# Patient Record
Sex: Male | Born: 1970 | Race: White | Hispanic: No | Marital: Married | Smoking: Never smoker
Health system: Southern US, Community
[De-identification: ages and names within clinical notes are randomized; demographics above are authoritative.]

## PROBLEM LIST (undated history)

## (undated) DIAGNOSIS — K219 Gastro-esophageal reflux disease without esophagitis: Secondary | ICD-10-CM

## (undated) DIAGNOSIS — R1310 Dysphagia, unspecified: Secondary | ICD-10-CM

## (undated) HISTORY — DX: Gastro-esophageal reflux disease without esophagitis: K21.9

## (undated) HISTORY — PX: FINGER SURGERY: SHX640

## (undated) HISTORY — DX: Dysphagia, unspecified: R13.10

## (undated) HISTORY — PX: SHOULDER SURGERY: SHX246

---

## 2002-08-05 ENCOUNTER — Encounter: Payer: Self-pay | Admitting: Emergency Medicine

## 2002-08-05 ENCOUNTER — Emergency Department (HOSPITAL_COMMUNITY): Admission: EM | Admit: 2002-08-05 | Discharge: 2002-08-05 | Payer: Self-pay | Admitting: Emergency Medicine

## 2007-05-17 ENCOUNTER — Emergency Department (HOSPITAL_COMMUNITY): Admission: EM | Admit: 2007-05-17 | Discharge: 2007-05-17 | Payer: Self-pay | Admitting: Emergency Medicine

## 2009-09-05 ENCOUNTER — Emergency Department (HOSPITAL_COMMUNITY): Admission: EM | Admit: 2009-09-05 | Discharge: 2009-09-05 | Payer: Self-pay | Admitting: Emergency Medicine
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
## 2011-06-21 ENCOUNTER — Encounter: Payer: Self-pay | Admitting: Internal Medicine

## 2011-06-22 ENCOUNTER — Encounter: Payer: Self-pay | Admitting: Gastroenterology

## 2011-06-22 ENCOUNTER — Ambulatory Visit (INDEPENDENT_AMBULATORY_CARE_PROVIDER_SITE_OTHER): Payer: BC Managed Care – PPO | Admitting: Gastroenterology

## 2011-06-22 VITALS — BP 110/76 | HR 72 | Temp 98.0°F | Ht 72.0 in | Wt 174.2 lb

## 2011-06-22 DIAGNOSIS — R1310 Dysphagia, unspecified: Secondary | ICD-10-CM | POA: Insufficient documentation

## 2011-06-22 DIAGNOSIS — K219 Gastro-esophageal reflux disease without esophagitis: Secondary | ICD-10-CM

## 2011-06-22 NOTE — Progress Notes (Signed)
Referring Provider: [name], PA/Dr. Phillips Odor Primary Care Physician:  Colette Ribas, MD Primary Gastroenterologist:  Dr. Jena Gauss  Chief Complaint  Patient presents with  . Dysphagia    HPI:   Miguel Reynolds is a pleasant 40 year old Caucasian male who presents today at the request of [name], [place], secondary to dysphagia. He reports dysphagia and odynophagia X 1 year, intermittent. Difficulty with tougher textures, states sandwhiches get "hung". He was started on Prilosec 40 mg mid October, and he has noticed a significant improvement in his symptoms. Does admit prior to this noticed worsening of reflux, needing Tums and Zantac. However, he had not tried a PPI until recently. As stated above, he has notable improvement. He is still wanting to pursue an EGD.   No abdominal pain. No melena. No wt loss or lack of appetite.  Takes ibuprofen, intermittently.    Past Medical History  Diagnosis Date  . GERD (gastroesophageal reflux disease)     Past Surgical History  Procedure Date  . Finger surgery     pins    Current Outpatient Prescriptions  Medication Sig Dispense Refill  . OLANZapine (ZYPREXA) 10 MG tablet Take 10 mg by mouth at bedtime.        Marland Kitchen omeprazole (PRILOSEC) 40 MG capsule Take 40 mg by mouth daily.          Allergies as of 06/22/2011  . (No Known Allergies)    Family History  Problem Relation Age of Onset  . Colon cancer Neg Hx   . Colon polyps Neg Hx     History   Social History  . Marital Status: Married    Spouse Name: N/A    Number of Children: N/A  . Years of Education: N/A   Occupational History  . Curator    Social History Main Topics  . Smoking status: Never Smoker   . Smokeless tobacco: Not on file  . Alcohol Use: No     rarely  . Drug Use: No  . Sexually Active: Not on file   Other Topics Concern  . Not on file   Social History Narrative  . No narrative on file    Review of Systems: Gen: Denies any fever, chills, loss  of appetite, fatigue, weight loss. CV: Denies chest pain, heart palpitations, syncope, peripheral edema. Resp: Denies shortness of breath with rest, cough, wheezing GI: SEE HPI GU : Denies urinary burning, urinary frequency, urinary incontinence.  MS: Denies joint pain, muscle weakness, cramps, limited movement Derm: Denies rash, itching, dry skin Psych: Denies depression, anxiety, confusion or memory loss  Heme: Denies bruising, bleeding, and enlarged lymph nodes.  Physical Exam: BP 110/76  Pulse 72  Temp(Src) 98 F (36.7 C) (Temporal)  Ht 6' (1.829 m)  Wt 174 lb 3.2 oz (79.017 kg)  BMI 23.63 kg/m2 General:   Alert and oriented. Well-developed, well-nourished, pleasant and cooperative. Head:  Normocephalic and atraumatic. Eyes:  Conjunctiva pink, sclera clear, no icterus.    Ears:  Normal auditory acuity. Nose:  No deformity, discharge,  or lesions. Mouth:  No deformity or lesions, mucosa pink and moist.  Neck:  Supple, without mass or thyromegaly. Lungs:  Clear to auscultation bilaterally, without wheezing, rales, or rhonchi.  Heart:  S1, S2 present without murmurs noted.  Abdomen:  +BS, soft, non-tender and non-distended. Without mass or HSM. No rebound or guarding. No hernias noted. Rectal:  Deferred  Msk:  Symmetrical without gross deformities. Normal posture. Extremities:  Without clubbing or edema. Neurologic:  Alert and  oriented x4;  grossly normal neurologically. Skin:  Intact, warm and dry without significant lesions or rashes Cervical Nodes:  No significant cervical adenopathy. Psych:  Alert and cooperative. Normal mood and affect.

## 2011-06-22 NOTE — Assessment & Plan Note (Signed)
Likely secondary to uncontrolled GERD, unable to r/o web, ring, or stricture. Will proceed with EGD as planned.

## 2011-06-22 NOTE — Assessment & Plan Note (Signed)
40 year old male with year-long symptoms of GERD and intermittent dysphagia/odynophagia. This has improved significantly since starting Prilosec 40 mg in mid October. In fact, he has had no further dysphagia since this time. However, he states he is concerned and would like to pursue an EGD in the near future. This is reasonable. We will have him continue Prilosec daily and proceed with an EGD with Dr. Jena Gauss.  Proceed with upper endoscopy in the near future with Dr. Jena Gauss. The risks, benefits, and alternatives have been discussed in detail with patient. They have stated understanding and desire to proceed.

## 2011-06-22 NOTE — Progress Notes (Signed)
Cc to PCP 

## 2011-06-22 NOTE — Patient Instructions (Signed)
We have set you up for an upper endoscopy to look at your esophagus and stomach. This will be done with Dr. Jena Gauss.  In the meantime, continue the Prilosec you are taking.

## 2011-07-10 ENCOUNTER — Encounter (HOSPITAL_COMMUNITY): Payer: Self-pay | Admitting: Pharmacy Technician

## 2011-07-14 MED ORDER — SODIUM CHLORIDE 0.45 % IV SOLN: Freq: Once | INTRAVENOUS | Status: DC

## 2011-07-17 ENCOUNTER — Encounter (HOSPITAL_COMMUNITY): Payer: Self-pay | Admitting: *Deleted

## 2011-07-17 ENCOUNTER — Encounter (HOSPITAL_COMMUNITY)
Admission: RE | Disposition: A | Discharge status: Home / Self Care | Payer: Self-pay | Source: Home / Self Care | Attending: Internal Medicine

## 2011-07-17 ENCOUNTER — Ambulatory Visit (HOSPITAL_COMMUNITY)
Admission: RE | Admit: 2011-07-17 | Discharge: 2011-07-17 | Disposition: A | Discharge status: Home / Self Care | Payer: BC Managed Care – PPO | Attending: Internal Medicine | Admitting: Internal Medicine

## 2011-07-17 DIAGNOSIS — K219 Gastro-esophageal reflux disease without esophagitis: Secondary | ICD-10-CM | POA: Insufficient documentation

## 2011-07-17 DIAGNOSIS — R1310 Dysphagia, unspecified: Secondary | ICD-10-CM | POA: Insufficient documentation

## 2011-07-17 DIAGNOSIS — K449 Diaphragmatic hernia without obstruction or gangrene: Secondary | ICD-10-CM | POA: Insufficient documentation

## 2011-07-17 SURGERY — ESOPHAGOGASTRODUODENOSCOPY (EGD) WITH ESOPHAGEAL DILATION
Anesthesia: Moderate Sedation

## 2011-07-17 MED ORDER — MEPERIDINE HCL 100 MG/ML IJ SOLN
INTRAMUSCULAR | Status: DC | PRN
  Administered 2011-07-17 (×2): 50 mg via INTRAVENOUS

## 2011-07-17 MED ORDER — MIDAZOLAM HCL 5 MG/5ML IJ SOLN
INTRAMUSCULAR | Status: AC
  Filled 2011-07-17: qty 5

## 2011-07-17 MED ORDER — BUTAMBEN-TETRACAINE-BENZOCAINE 2-2-14 % EX AERO
INHALATION_SPRAY | CUTANEOUS | Status: DC | PRN
  Administered 2011-07-17: 2 via TOPICAL

## 2011-07-17 MED ORDER — MIDAZOLAM HCL 5 MG/5ML IJ SOLN
INTRAMUSCULAR | Status: DC | PRN
  Administered 2011-07-17 (×2): 2 mg via INTRAVENOUS

## 2011-07-17 MED ORDER — MEPERIDINE HCL 100 MG/ML IJ SOLN
INTRAMUSCULAR | Status: AC
  Filled 2011-07-17: qty 1

## 2011-07-17 MED ORDER — STERILE WATER FOR IRRIGATION IR SOLN
Status: DC | PRN
  Administered 2011-07-17: 11:00:00

## 2011-07-17 NOTE — H&P (Signed)
  I have seen & examined the patient prior to the procedure(s) today and reviewed the history and physical/consultation.  There have been no changes.  After consideration of the risks, benefits, alternatives and imponderables, the patient has consented to the procedure(s).   

## 2014-10-14 ENCOUNTER — Ambulatory Visit (INDEPENDENT_AMBULATORY_CARE_PROVIDER_SITE_OTHER): Payer: Managed Care, Other (non HMO) | Admitting: Neurology

## 2014-10-14 ENCOUNTER — Encounter: Payer: Self-pay | Admitting: Neurology

## 2014-10-14 VITALS — BP 116/86 | HR 72 | Resp 16 | Ht 72.0 in | Wt 185.0 lb

## 2014-10-14 DIAGNOSIS — G478 Other sleep disorders: Secondary | ICD-10-CM | POA: Diagnosis not present

## 2014-10-14 DIAGNOSIS — R0683 Snoring: Secondary | ICD-10-CM

## 2014-10-14 DIAGNOSIS — L503 Dermatographic urticaria: Secondary | ICD-10-CM | POA: Diagnosis not present

## 2014-10-14 DIAGNOSIS — G4731 Primary central sleep apnea: Secondary | ICD-10-CM
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
## 2014-10-14 NOTE — Patient Instructions (Signed)
Sleep Apnea  Sleep apnea is a sleep disorder characterized by abnormal pauses in breathing while you sleep. When your breathing pauses, the level of oxygen in your blood decreases. This causes you to move out of deep sleep and into light sleep. As a result, your quality of sleep is poor, and the system that carries your blood throughout your body (cardiovascular system) experiences stress. If sleep apnea remains untreated, the following conditions can develop:  High blood pressure (hypertension).  Coronary artery disease.  Inability to achieve or maintain an erection (impotence).  Impairment of your thought process (cognitive dysfunction). There are three types of sleep apnea: 1. Obstructive sleep apnea--Pauses in breathing during sleep because of a blocked airway. 2. Central sleep apnea--Pauses in breathing during sleep because the area of the brain that controls your breathing does not send the correct signals to the muscles that control breathing. 3. Mixed sleep apnea--A combination of both obstructive and central sleep apnea. RISK FACTORS The following risk factors can increase your risk of developing sleep apnea:  Being overweight.  Smoking.  Having narrow passages in your nose and throat.  Being of older age.  Being male.  Alcohol use.  Sedative and tranquilizer use.  Ethnicity. Among individuals younger than 35 years, African Americans are at increased risk of sleep apnea. SYMPTOMS   Difficulty staying asleep.  Daytime sleepiness and fatigue.  Loss of energy.  Irritability.  Loud, heavy snoring.  Morning headaches.  Trouble concentrating.  Forgetfulness.  Decreased interest in sex. DIAGNOSIS  In order to diagnose sleep apnea, your caregiver will perform a physical examination. Your caregiver may suggest that you take a home sleep test. Your caregiver may also recommend that you spend the night in a sleep lab. In the sleep lab, several monitors record  information about your heart, lungs, and brain while you sleep. Your leg and arm movements and blood oxygen level are also recorded. TREATMENT The following actions may help to resolve mild sleep apnea:  Sleeping on your side.   Using a decongestant if you have nasal congestion.   Avoiding the use of depressants, including alcohol, sedatives, and narcotics.   Losing weight and modifying your diet if you are overweight. There also are devices and treatments to help open your airway:  Oral appliances. These are custom-made mouthpieces that shift your lower jaw forward and slightly open your bite. This opens your airway.  Devices that create positive airway pressure. This positive pressure "splints" your airway open to help you breathe better during sleep. The following devices create positive airway pressure:  Continuous positive airway pressure (CPAP) device. The CPAP device creates a continuous level of air pressure with an air pump. The air is delivered to your airway through a mask while you sleep. This continuous pressure keeps your airway open.  Nasal expiratory positive airway pressure (EPAP) device. The EPAP device creates positive air pressure as you exhale. The device consists of single-use valves, which are inserted into each nostril and held in place by adhesive. The valves create very little resistance when you inhale but create much more resistance when you exhale. That increased resistance creates the positive airway pressure. This positive pressure while you exhale keeps your airway open, making it easier to breath when you inhale again.  Bilevel positive airway pressure (BPAP) device. The BPAP device is used mainly in patients with central sleep apnea. This device is similar to the CPAP device because it also uses an air pump to deliver continuous air pressure   through a mask. However, with the BPAP machine, the pressure is set at two different levels. The pressure when you  exhale is lower than the pressure when you inhale.  Surgery. Typically, surgery is only done if you cannot comply with less invasive treatments or if the less invasive treatments do not improve your condition. Surgery involves removing excess tissue in your airway to create a wider passage way. Document Released: 07/21/2002 Document Revised: 11/25/2012 Document Reviewed: 12/07/2011 Exit[employer] Patient Information 2015 [employer]. This information is not intended to replace advice given to you by your health care provider. Make sure you discuss any questions you have with your health care provider.  

## 2014-11-03 ENCOUNTER — Ambulatory Visit (INDEPENDENT_AMBULATORY_CARE_PROVIDER_SITE_OTHER): Payer: Managed Care, Other (non HMO) | Admitting: Neurology

## 2014-11-03 VITALS — BP 150/88 | HR 69 | Resp 14

## 2014-11-03 DIAGNOSIS — R0683 Snoring: Secondary | ICD-10-CM

## 2014-11-03 DIAGNOSIS — G4731 Primary central sleep apnea: Secondary | ICD-10-CM | POA: Diagnosis not present

## 2014-11-03 DIAGNOSIS — G478 Other sleep disorders: Secondary | ICD-10-CM

## 2014-11-03 NOTE — Sleep Study (Signed)
Please see the scanned sleep study interpretation located in the Procedure tab within the Chart Review section.Please see the scanned sleep study interpretation located in the Procedure tab within the Chart Review section. 

## 2014-11-19 ENCOUNTER — Encounter: Payer: Self-pay | Admitting: Neurology

## 2014-11-24 ENCOUNTER — Telehealth: Payer: Self-pay

## 2014-11-24 NOTE — Telephone Encounter (Signed)
Left message to call back for sleep study results.  

## 2014-11-26 NOTE — Telephone Encounter (Signed)
Left message to call back for Sleep Study results. I also called work number but they stated that Leonette MostCharles is out on leave right now.

## 2014-12-03 ENCOUNTER — Other Ambulatory Visit: Payer: Self-pay | Admitting: Neurology

## 2014-12-03 DIAGNOSIS — G4733 Obstructive sleep apnea (adult) (pediatric): Secondary | ICD-10-CM

## 2014-12-03 NOTE — Telephone Encounter (Signed)
I spoke to [place] today. He is aware of sleep study results. He would like to proceed with AutoPAP. He will need a f/u appt with Dr. Vickey Hugerohmeier.

## 2014-12-04 ENCOUNTER — Encounter: Payer: Self-pay | Admitting: Neurology

## 2014-12-08 ENCOUNTER — Encounter: Payer: Self-pay | Admitting: *Deleted

## 2014-12-08 NOTE — Telephone Encounter (Signed)
Patient was contacted and referred to [facility] for CPAP set up.  Per the patient's request a copy of the sleep study was mailed to the patient along with a letter notifying him of the need to schedule a follow up appointment 6-8 weeks after being set up.

## 2015-01-12 NOTE — Progress Notes (Signed)
Quick Note:  Called patient he is scheduled for apt with Dr. Vickey Hugerohmeier in July 11 th. Patient will start his C-PAP June 1st. ______

## 2015-02-22 ENCOUNTER — Ambulatory Visit (INDEPENDENT_AMBULATORY_CARE_PROVIDER_SITE_OTHER): Payer: Managed Care, Other (non HMO) | Admitting: Neurology

## 2015-02-22 ENCOUNTER — Encounter: Payer: Self-pay | Admitting: Neurology

## 2015-02-22 VITALS — BP 120/78 | HR 72 | Resp 20 | Ht 72.0 in | Wt 188.0 lb

## 2015-02-22 DIAGNOSIS — Z9989 Dependence on other enabling machines and devices: Secondary | ICD-10-CM

## 2015-02-22 DIAGNOSIS — G4733 Obstructive sleep apnea (adult) (pediatric): Secondary | ICD-10-CM | POA: Diagnosis not present

## 2015-02-22 DIAGNOSIS — G4726 Circadian rhythm sleep disorder, shift work type: Secondary | ICD-10-CM

## 2015-02-22 MED ORDER — MODAFINIL 200 MG PO TABS: 200.0000 mg | ORAL_TABLET | Freq: Every day | ORAL | Status: DC

## 2015-02-22 NOTE — Progress Notes (Signed)
SLEEP MEDICINE CLINIC   Provider:  Melvyn Novas, M D  Referring Provider: Assunta Found, MD Primary Care Physician:  Colette Ribas, MD  Chief Complaint  Patient presents with  . Follow-up    cpap, rm 10, alone    HPI:  Miguel Reynolds is a 44 y.o. male , caucasian, right handed, married male, seen here as a referral from Dr Phillips Odor, [name], for a sleep medicine consultation.  Miguel Reynolds was referred for a chief complaint of witnessed snoring and witnessed apneas during sleep. Miguel Reynolds the nurse practitioner for Dr. Phillips Odor signed this referral was a request to rule out sleep apnea of obstructive or central origin. He further indicated that the patient has excessive daytime fatigue  Miguel Reynolds reports that he went to a horseshow in [place] , sharing a hotel suite with his wife and 3 other parties one of them a respiratory therapist. This respiratory therapist told him that she had witnessed 3 apneas in a short time when she observed his nocturnal sleep. Due to this observation and overnight pulse oximetry was ordered by his primary care physician, which revealed an oxygen nadir of 78% during the night. I do not have the original tracing here but apparently the results of the overnight oximetry were concerning enough to have the patient referred for a sleep study proper. He was at another show in [place] and was again told he was snoring and apnoeic.   He works full time for [employer], [employer] and is logging and farming. 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  He was put out of work in December due to a surgery , a shoulder reconstructon.  He couldn't climb trees or chop wood for 2 month and got more sleep, but still feels unrefreshed and unrestored in the morning.   His sleep habits are as follows: He usually goes to his bedroom and to bed at around 11:30 PM the latest is midnight. He  usually goes straight to bed he does not watch TV or read for long time. He falls asleep pretty promptly, and he wakes up repeatedly throughout the night. He will sleep about 2 hours en bloc before waking up but he is not sure what wakes him up. It is neither vivid dreams or nightmares it is usually not nocturia and his suspicion is that he may be related to his breathing pattern. He has never been aware of his own apneas but his wife is notching him and waking him when she witnesses apnea. He snores in any position, but is only able to sleep on his back since the shoulder surgery. He bedroom is cool, quiet and dark.  His wife normally rises at 6 AM to get ready for work so he usually wakes up at the same time. As he is currently not working he can stay in bed until 9 or 9:30. He may get some extra hours of sleep this way too. He still does not feel refreshed or restored in the morning. While he was working he felt often so tired he would fall asleep on the passenger's seat driving 20 mile distances or less. Currently that is not the case. He does not take  any naps during the day,  and he is not as high at risk of sleeping inadvertently. He gets about 7-10  hours a day, wide  variety.  He doesn't wake  with headaches but with a dry mouth.  He has a "difficult time getting up and moving " He drinks a cup of coffee in AM, no sodas , one iced tea at lunch. Usually drinks water, No ETOH , no tobacco.  No history of TBI and no broken nose or jaw.   02-22-15 Miguel Reynolds underwent a sleep study on 3-20 2-16 which revealed an AHI of 20 and an RDI of 20 with a strong REM accentuation 290 AHI there was not so much positional component noted. He was titrated to CPAP and placed on AutoSet. His current CPAP allows between 5 and 12 cm water pressure with an EPR of 3. Compliance data from 02-16-15 show 100% compliance for days and 67% compliance for over 4 hours of nightly use. 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 This does not reflect his true consistent use of CPAP. He is using it consecutively for over 4 hours on average. His average user time is 5 hours 8 minutes and his residual AHI is 2.1. The 91st percentile pressure is 10.4. At this time I see no need to change any of the settings he does have minor air leaks that do not require a new mask. His Epworth sleepiness score is 3 points and his fatigue severity score is 15 points. I think he is doing very well with the current setting and I would like him to continue using this. Miguel Reynolds is currently fighting a sinus infection and I encouraged him to not use the CPAP for 3 days to allow draining the mucus. He may do well with squared phenelzine or any other Mucinex-like product and hot liquids to facilitate this process.  Review of Systems: Out of a complete 14 system review, the patient complains of only the following symptoms, and all other reviewed systems are negative. Snoring, witnessed apneas.  epworth now 3, from 6 prior to CPAP.   Epworth score 6 , Fatigue severity score 14   , depression score 4 points.    History   Social History  . Marital Status: Married    Spouse Name: N/A  . Number of Children: 0  . Years of Education: college   Occupational History  . Curator    Social History Main Topics  . Smoking status: Never Smoker   . Smokeless tobacco: Never Used  . Alcohol Use: 1.8 oz/week    3 Cans of beer per week     Comment: rarely  . Drug Use: No  . Sexual Activity: Not on file   Other Topics Concern  . Not on file   Social History Narrative   Patient lives at home with his wife Miguel Reynolds).    Patient works full time at [name] , and he is a Visual merchandiser.   Education college.   Right handed.   Caffeine one cup daily coffee.     Family History  Problem Relation Age of Onset  . Colon cancer Neg Hx   . Colon polyps Neg Hx     Past Medical History    Diagnosis Date  . GERD (gastroesophageal reflux disease)     Past Surgical History  Procedure Laterality Date  . Finger surgery      pins  . Shoulder surgery Right     Current Outpatient Prescriptions  Medication Sig Dispense Refill  . ibuprofen (ADVIL,MOTRIN) 200  MG tablet Take 800 mg by mouth every 6 (six) hours as needed. For pain     . Multiple Vitamins-Minerals (MULTIVITAMINS THER. W/MINERALS) TABS Take 1 tablet by mouth daily.      Marland Kitchen omeprazole (PRILOSEC) 40 MG capsule Take 40 mg by mouth daily.      Marland Kitchen UNABLE TO FIND Take 1 tablet by mouth daily. Allertec      No current facility-administered medications for this visit.    Allergies as of 02/22/2015  . (No Known Allergies)    Vitals: BP 120/78 mmHg  Pulse 72  Resp 20  Ht 6' (1.829 m)  Wt 188 lb (85.276 kg)  BMI 25.49 kg/m2 Last Weight:  Wt Readings from Last 1 Encounters:  02/22/15 188 lb (85.276 kg)       Last Height:   Ht Readings from Last 1 Encounters:  02/22/15 6' (1.829 m)    Physical exam:  General: The patient is awake, alert and appears not in acute distress. The patient is well groomed. Head: Normocephalic, atraumatic. Neck is supple. Mallampati 1, the upper airway looks irritated and slightly erythematous.  neck circumference: 15-1/2 inches. Nasal airflow unrestricted , TMJ is not  evident . Retrognathia is not seen.  Cardiovascular:  Regular rate and rhythm , without  murmurs or carotid bruit, and without distended neck veins. Respiratory: Lungs are clear to auscultation. Skin:  Without evidence of edema, or rash Trunk:  normal posture.  Neurologic exam : The patient is awake and alert, oriented to place and time.   Memory subjective described as intact. There is a normal attention span & concentration ability. Speech is fluent without dysarthria, dysphonia or aphasia. Mood and affect are appropriate.  Cranial nerves: Pupils are equal and briskly reactive to light. Funduscopic exam without  evidence of pallor or edema. Extraocular movements  in vertical and horizontal planes intact and without nystagmus. Visual fields by finger perimetry are intact. Hearing to finger rub intact.  Facial sensation intact to fine touch. Facial motor strength is symmetric and tongue and uvula move midline. The patient's shoulder shrug is slightly weaker on the right side he keeps the right shoulder at a higher level than the left there is some pain restriction especially with posterior rotation.  Motor exam:  Normal tone ,muscle bulk and symmetric strength in all extremities, except for the right shoulder . Marland Kitchen  Sensory:  Fine touch, pinprick and vibration were tested in all extremities. Proprioception is tested in the upper extremities only. This was  normal.  Coordination: Rapid alternating movements in the fingers/hands is normal.  Finger-to-nose maneuver normal without evidence of ataxia, dysmetria or tremor.  Gait and station: Patient walks without assistive device and is able unassisted to climb up to the exam table. Strength within normal limits. Stance is stable and normal. Tandem gait is unfragmented. Romberg testing is  negative.  Deep tendon reflexes: in the  upper and lower extremities are symmetric and intact. Babinski maneuver response is downgoing.   Assessment:  After physical and neurologic examination, review of laboratory studies, imaging, neurophysiology testing and pre-existing records, assessment is   Mr. szatkowski does not have the usual risk factors for obstructive sleep apnea, he is neither morbidly obese, he does not have a large neck, he does not have retrognathia and his Mallampati is grade 1. The witnessed snoring and apnea seems to have increased over the last year perhaps over the last 6 month. In addition he is a shift worker and his nocturnal sleep  has not been as restful and frequently interrupted by periods of wakefulness for unknown reasons. 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  The witnessed snoring is indicative of UARS, the  witnessed apneas may be central in origin , as  associated with the taking of pain medicine. This may explain the low oxygen saturation during the ONO as well.     The patient was advised of the nature of the diagnosed sleep disorder , the treatment options and risks for general a health and wellness arising from not treating the condition. Visit duration was 30 minutes.  50% of the visit time was spent in face to face discussion. The patient's questions were answered, the differences between central and obstructive apnea explained and treatment options.   Plan:  Treatment plan and additional workup :  I will order a SPLIt night PSG and set the [facility] to 15 and score at 3 %, the patient needs to be evaluated for central versus obstructive apnea.      Porfirio Mylararmen Safal Halderman MD  02/22/2015

## 2015-08-22 ENCOUNTER — Telehealth: Payer: Self-pay

## 2015-08-22 NOTE — Telephone Encounter (Signed)
Spoke to pt and advised him that his appt for 08/23/15 with Dr. Vickey Hugerohmeier will need to be rescheduled because the office is closed tomorrow 08/23/15. Pt is agreeable to seeing Aundra MilletMegan, NP at 10:30 on Friday, 08/27/15 for his cpap follow up.

## 2015-08-23 ENCOUNTER — Ambulatory Visit: Payer: Managed Care, Other (non HMO) | Admitting: Neurology

## 2015-08-27 ENCOUNTER — Ambulatory Visit (INDEPENDENT_AMBULATORY_CARE_PROVIDER_SITE_OTHER): Payer: 59 | Admitting: Adult Health

## 2015-08-27 ENCOUNTER — Encounter: Payer: Self-pay | Admitting: Adult Health

## 2015-08-27 VITALS — BP 132/85 | HR 69 | Ht 72.0 in | Wt 190.0 lb

## 2015-08-27 DIAGNOSIS — G4733 Obstructive sleep apnea (adult) (pediatric): Secondary | ICD-10-CM

## 2015-08-27 DIAGNOSIS — Z9989 Dependence on other enabling machines and devices: Secondary | ICD-10-CM

## 2015-08-27 NOTE — Patient Instructions (Signed)
Continue using CPAP nightly If your symptoms worsen or you develop new symptoms please let us know.   

## 2015-08-27 NOTE — Progress Notes (Addendum)
PATIENT: Miguel Reynolds DOB: Aug 14, 1971  REASON FOR VISIT: follow up- obstructive sleep apnea on CPAP HISTORY FROM: patient  HISTORY OF PRESENT ILLNESS: Mr. Miguel Reynolds is a 45 year old male with a history of obstructive sleep apnea on CPAP. He returns today for a compliance download. His download indicates that he uses machine 30 out of 30 days for compliance of 100%. He uses machine greater than 4 hours 27 out of 30 days for compliance of 90%. On average he uses his machine 6 hours and 12 minutes. His residual AHI is 1.6 on a minimum pressure of 5 cm of water and maximum pressure of 12 cm water. The patient does not have a significant leak with his machine. He states that he is beginning to get used to the machine. His only complaint is that his machine has been filling up with water and going in his face at night. He states that he has removed all the water from his machine in order to able to use it at night. He states that he called his DME company but has been unable to get anyone on the phone to set up an appointment for them to look at his machine. He denies any new neurological symptoms. He returns today for an evaluation. HISTORY 02/22/15: Miguel CrockerCharles R Mccleary is a 45 y.o. male , caucasian, right handed, married male, seen here as a referral from Dr Phillips OdorGolding, [place], for a sleep medicine consultation.  Mr. Miguel Reynolds was referred for a chief complaint of witnessed snoring and witnessed apneas during sleep. Lenise HeraldBenjamin Mann the nurse practitioner for Dr. Phillips OdorGolding signed this referral was a request to rule out sleep apnea of obstructive or central origin. He further indicated that the patient has excessive daytime fatigue  Mr. Miguel Reynolds reports that he went to a [place] in [place] , sharing a hotel suite with his wife and 3 other parties one of them a respiratory therapist. This respiratory therapist told him that she had witnessed 3 apneas in a short time when she observed his nocturnal sleep. Due to  this observation and overnight pulse oximetry was ordered by his primary care physician, which revealed an oxygen nadir of 78% during the night. I do not have the original tracing here but apparently the results of the overnight oximetry were concerning enough to have the patient referred for a sleep study proper. He was at another show in [place] and was again told he was snoring and apnoeic.   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 He was put out of work in December due to a surgery , a shoulder reconstructon. He couldn't climb trees or chop wood for 2 month and got more sleep, but still feels unrefreshed and unrestored in the morning.   His sleep habits are as follows: He usually goes to his bedroom and to bed at around 11:30 PM the latest is midnight. He usually goes straight to bed he does not watch TV or read for long time. He falls asleep pretty promptly, and he wakes up repeatedly throughout the night. He will sleep about 2 hours en bloc before waking up but he is not sure what wakes him up. It is neither vivid dreams or nightmares it is usually not nocturia and his suspicion is that he may be related to  his breathing pattern. He has never been aware of his own apneas but his wife is notching him and waking him when she witnesses apnea. He snores in any position, but is only able to sleep on his back since the shoulder surgery. He bedroom is cool, quiet and dark.  His wife normally rises at 6 AM to get ready for work so he usually wakes up at the same time. As he is currently not working he can stay in bed until 9 or 9:30. He may get some extra hours of sleep this way too. He still does not feel refreshed or restored in the morning. While he was working he felt often so tired he would fall asleep on the passenger's seat driving 20 mile  distances or less. Currently that is not the case. He does not take any naps during the day, and he is not as high at risk of sleeping inadvertently. He gets about 7-10 hours a day, wide variety. He doesn't wake with headaches but with a dry mouth. He has a "difficult time getting up and moving " He drinks a cup of coffee in AM, no sodas , one iced tea at lunch. Usually drinks water, No ETOH , no tobacco. No history of TBI and no broken nose or jaw.   02-22-15 Mr. kissinger underwent a sleep study on 3-20 2-16 which revealed an AHI of 20 and an RDI of 20 with a strong REM accentuation 290 AHI there was not so much positional component noted. He was titrated to CPAP and placed on AutoSet. His current CPAP allows between 5 and 12 cm water pressure with an EPR of 3. Compliance data from 02-16-15 show 100% compliance for days and 67% compliance for over 4 hours of nightly use. 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 This does not reflect his true consistent use of CPAP. He is using it consecutively for over 4 hours on average. His average user time is 5 hours 8 minutes and his residual AHI is 2.1. The 91st percentile pressure is 10.4. At this time I see no need to change any of the settings he does have minor air leaks that do not require a new mask. His Epworth sleepiness score is 3 points and his fatigue severity score is 15 points. I think he is doing very well with the current setting and I would like him to continue using this. Mr. doss is currently fighting a sinus infection and I encouraged him to not use the CPAP for 3 days to allow draining the mucus. He may do well with squared phenelzine or any other Mucinex-like product and hot liquids to facilitate this process.   REVIEW OF SYSTEMS: Out of a complete 14 system review of symptoms, the patient complains only of the following symptoms, and all other reviewed systems are  negative.  See history of present illness  ALLERGIES: No Known Allergies  HOME MEDICATIONS: Outpatient Prescriptions Prior to Visit  Medication Sig Dispense Refill  . ibuprofen (ADVIL,MOTRIN) 200 MG tablet Take 800 mg by mouth every 6 (six) hours as needed. For pain     . modafinil (PROVIGIL) 200 MG tablet Take 1 tablet (200 mg total) by mouth daily. 30 tablet 5  . Multiple Vitamins-Minerals (MULTIVITAMINS THER. W/MINERALS) TABS Take 1 tablet by mouth daily.      Marland Kitchen omeprazole (PRILOSEC) 40 MG capsule Take 40 mg by mouth  daily.      Marland Kitchen UNABLE TO FIND Take 1 tablet by mouth daily. Allertec      No facility-administered medications prior to visit.    PAST MEDICAL HISTORY: Past Medical History  Diagnosis Date  . GERD (gastroesophageal reflux disease)     PAST SURGICAL HISTORY: Past Surgical History  Procedure Laterality Date  . Finger surgery      pins  . Shoulder surgery Right     FAMILY HISTORY: Family History  Problem Relation Age of Onset  . Colon cancer Neg Hx   . Colon polyps Neg Hx     SOCIAL HISTORY: Social History   Social History  . Marital Status: Married    Spouse Name: N/A  . Number of Children: 0  . Years of Education: college   Occupational History  . Curator    Social History Main Topics  . Smoking status: Never Smoker   . Smokeless tobacco: Never Used  . Alcohol Use: 1.8 oz/week    3 Cans of beer per week     Comment: rarely  . Drug Use: No  . Sexual Activity: Not on file   Other Topics Concern  . Not on file   Social History Narrative   Patient lives at home with his wife [name]).    Patient works full time at [name] , and he is a Visual merchandiser.   Education college.   Right handed.   Caffeine one cup daily coffee.       PHYSICAL EXAM  Filed Vitals:   08/27/15 1023  BP: 132/85  Pulse: 69  Height: 6' (1.829 m)  Weight: 190 lb (86.183 kg)   Body mass index is 25.76 kg/(m^2).  Generalized: Well developed, in no acute  distress   Neurological examination  Mentation: Alert oriented to time, place, history taking. Follows all commands speech and language fluent Cranial nerve II-XII: Pupils were equal round reactive to light. Extraocular movements were full, visual field were full on confrontational test. Facial sensation and strength were normal. Uvula tongue midline. Head turning and shoulder shrug  were normal and symmetric. Motor: The motor testing reveals 5 over 5 strength of all 4 extremities. Good symmetric motor tone is noted throughout.  Sensory: Sensory testing is intact to soft touch on all 4 extremities. No evidence of extinction is noted.  Coordination: Cerebellar testing reveals good finger-nose-finger and heel-to-shin bilaterally.  Gait and station: Gait is normal. Tandem gait is normal. Romberg is negative. No drift is seen.  Reflexes: Deep tendon reflexes are symmetric and normal bilaterally.   DIAGNOSTIC DATA (LABS, IMAGING, TESTING) - I reviewed patient records, labs, notes, testing and imaging myself where available.     ASSESSMENT AND PLAN 45 y.o. year old male  has a past medical history of GERD (gastroesophageal reflux disease). here with:  1. Obstructive sleep apnea on CPAP  Overall the patient is doing well. His compliance download is excellent. He is encouraged to continue using his CPAP nightly. I have [facility] she will call the patient to set up an appointment for him to have his machine evaluated. Patient advised that he will follow-up in 6 months with Dr. Vickey Huger.     Butch Penny, MSN, NP-C 08/27/2015, 10:48 AM [facility] [address], [address] [place], [place] [address] [phone]   I reviewed the above note and documentation by the Nurse Practitioner and agree with the history, physical exam, assessment and plan as outlined above. I  was immediately available for face-to-face consultation. Huston Foley, MD,  PhD [facility])

## 2016-03-06 ENCOUNTER — Ambulatory Visit (INDEPENDENT_AMBULATORY_CARE_PROVIDER_SITE_OTHER): Payer: 59 | Admitting: Neurology

## 2016-03-06 ENCOUNTER — Encounter: Payer: Self-pay | Admitting: Neurology

## 2016-03-06 VITALS — BP 131/85 | HR 63 | Ht 72.0 in | Wt 184.0 lb

## 2016-03-06 DIAGNOSIS — G4733 Obstructive sleep apnea (adult) (pediatric): Secondary | ICD-10-CM | POA: Diagnosis not present

## 2016-03-06 DIAGNOSIS — Q674 Other congenital deformities of skull, face and jaw: Secondary | ICD-10-CM

## 2016-03-06 DIAGNOSIS — Z9989 Dependence on other enabling machines and devices: Secondary | ICD-10-CM | POA: Insufficient documentation

## 2016-03-06 MED ORDER — FEXOFENADINE HCL 60 MG PO TABS: 60.0000 mg | ORAL_TABLET | Freq: Two times a day (BID) | ORAL | Status: DC

## 2016-03-06 NOTE — Progress Notes (Signed)
SLEEP MEDICINE CLINIC   Provider:  Melvyn Novas, M D  Referring Provider: Assunta Found, MD Primary Care Physician:  Colette Ribas, MD  Chief Complaint  Patient presents with  . OSA on CPAP    No new concerns today.  States he is wearing his CPAP between 6-8 hours per night.    HPI:  Miguel Reynolds is a 45 y.o. male , caucasian, right handed, married male, seen here as a referral from Dr Phillips Odor, [name], for a sleep medicine consultation.  Miguel Reynolds was referred for a chief complaint of witnessed snoring and witnessed apneas during sleep. [name] the nurse practitioner for Dr. Phillips Odor signed this referral was a request to rule out sleep apnea of obstructive or central origin. He further indicated that the patient has excessive daytime fatigue Mr. Aymond reports that he went to a horseshow in [place] , sharing a hotel suite with his wife and 3 other parties one of them a respiratory therapist. This respiratory therapist told him that she had witnessed 3 apneas in a short time when she observed his nocturnal sleep. Due to this observation and overnight pulse oximetry was ordered by his primary care physician, which revealed an oxygen nadir of 78% during the night. I do not have the original tracing here but apparently the results of the overnight oximetry were concerning enough to have the patient referred for a sleep study proper. He was at another show in [place] and was again told he was snoring and apnoeic.   He works full time for [employer], [employer] and is logging and farming. 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 He was put out of work in December due to a surgery , a shoulder reconstructon.  He couldn't climb trees or chop wood for 2 month and got more sleep, but still feels unrefreshed and unrestored in the morning.   His sleep habits are as follows: He usually goes to his  bedroom and to bed at around 11:30 PM the latest is midnight. He usually goes straight to bed he does not watch TV or read for long time. He falls asleep pretty promptly, and he wakes up repeatedly throughout the night. He will sleep about 2 hours en bloc before waking up but he is not sure what wakes him up. It is neither vivid dreams or nightmares it is usually not nocturia and his suspicion is that he may be related to his breathing pattern. He has never been aware of his own apneas but his wife is notching him and waking him when she witnesses apnea. He snores in any position, but is only able to sleep on his back since the shoulder surgery. He bedroom is cool, quiet and dark.  His wife normally rises at 6 AM to get ready for work so he usually wakes up at the same time. As he is currently not working he can stay in bed until 9 or 9:30. He may get some extra hours of sleep this way too. He still does not feel refreshed or restored in the morning. While he was working he felt often so tired he would fall asleep on the passenger's seat driving 20 mile distances or less. Currently that is not the case. He does not take  any naps during the day,  and he is not as high at risk  of sleeping inadvertently. He gets about 7-10  hours a day, wide variety.  He doesn't wake  with headaches but with a dry mouth.  He has a "difficult time getting up and moving " He drinks a cup of coffee in AM, no sodas , one iced tea at lunch. Usually drinks water, No ETOH , no tobacco.  No history of TBI , broken nose or jaw.   02-22-15 Miguel Reynolds underwent a sleep study on 11-03-14 which revealed an AHI of 20 and an RDI of 20 with a strong REM accentuation 290 AHI there was not so much positional component noted. He was titrated to CPAP and placed on AutoSet. His current CPAP allows between 5 and 12 cm water pressure with an EPR of 3. Compliance data from 02-16-15 show 100% compliance for days and 67% compliance for over 4 hours of  nightly use. 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 This does not reflect his true consistent use of CPAP. He is using it consecutively for over 4 hours on average. His average user time is 5 hours 8 minutes and his residual AHI is 2.1. The 91st percentile pressure is 10.4. At this time I see no need to change any of the settings he does have minor air leaks that do not require a new mask. His Epworth sleepiness score is 3 points and his fatigue severity score is 15 points. I think he is doing very well with the current setting and I would like him to continue using this. Miguel Reynolds is currently fighting a sinus infection and I encouraged him to not use the CPAP for 3 days to allow draining the mucus. He may do well with squared phenelzine or any other Mucinex-like product and hot liquids to facilitate this process.  Interval history from 03/06/2016. I have the pleasure of seeing Miguel Reynolds today, for routine yearly follow-up on CPAP use. The patient is on an AutoSet between 5 and 12 cm water with 3 cm EPR and has a compliance of 100% with an average user time of 7 hours and 3 minutes. Residual AHI is 2.5, an excellent result. Epworth sleepiness score endorsed at 3 points, fatigue severity at 15 points. Miguel Reynolds had remarkably enlarged tonsils when I initially evaluated him and his question is if he could be successfully treating apnea and snoring by having them removed. He also has a deviated nasal septum nontraumatic. I think it would be feasible to correct both allowing him to have easy a nasal airway passage and reduce the upper airway resistance.   Review of Systems: Out of a complete 14 system review, the patient complains of only the following symptoms, and all other reviewed systems are negative. Snoring, witnessed apneas.  epworth now 3, from 6 prior to CPAP.   Epworth score  3 from 6 , Fatigue severity score  15 from 14    , depression score 2/15  points.    Social History   Social History  . Marital status: Married    Spouse name: N/A  . Number of children: 0  . Years of education: college   Occupational History  . Curator    Social History Main Topics  . Smoking status: Never Smoker  . Smokeless tobacco: Never Used  . Alcohol use 1.8 oz/week    3 Cans of beer per week     Comment: rarely  .  Drug use: No  . Sexual activity: Not on file   Other Topics Concern  . Not on file   Social History Narrative   Patient lives at home with his wife Miguel Reynolds).    Patient works full time at [name] , and he is a Visual merchandiser.   Education college.   Right handed.   Caffeine one cup daily coffee.     Family History  Problem Relation Age of Onset  . Colon cancer Neg Hx   . Colon polyps Neg Hx     Past Medical History:  Diagnosis Date  . GERD (gastroesophageal reflux disease)     Past Surgical History:  Procedure Laterality Date  . FINGER SURGERY     pins  . SHOULDER SURGERY Right     Current Outpatient Prescriptions  Medication Sig Dispense Refill  . ibuprofen (ADVIL,MOTRIN) 200 MG tablet Take 800 mg by mouth every 6 (six) hours as needed. For pain     . modafinil (PROVIGIL) 200 MG tablet Take 1 tablet (200 mg total) by mouth daily. 30 tablet 5  . Multiple Vitamins-Minerals (MULTIVITAMINS THER. W/MINERALS) TABS Take 1 tablet by mouth daily.      Marland Kitchen omeprazole (PRILOSEC) 40 MG capsule Take 40 mg by mouth daily.      Marland Kitchen UNABLE TO FIND Take 1 tablet by mouth daily. Allertec      No current facility-administered medications for this visit.     Allergies as of 03/06/2016  . (No Known Allergies)    Vitals: BP 131/85   Pulse 63   Ht 6' (1.829 m)   Wt 184 lb (83.5 kg)   BMI 24.95 kg/m  Last Weight:  Wt Readings from Last 1 Encounters:  03/06/16 184 lb (83.5 kg)       Last Height:   Ht Readings from Last 1 Encounters:  03/06/16 6' (1.829 m)    Physical exam:  General: The  patient is awake, alert and appears not in acute distress. The patient is well groomed. Head: Normocephalic, atraumatic. Neck is supple. Mallampati 1, the upper airway looks irritated and slightly erythematous.  neck circumference: 15-1/2 inches. Nasal airflow unrestricted , TMJ is not  evident . Retrognathia is not seen.  Cardiovascular:  Regular rate and rhythm , without  murmurs or carotid bruit, and without distended neck veins. Respiratory: Lungs are clear to auscultation. Nasal airflow is congested.  Skin:  Without evidence of edema, or rash Trunk:  normal posture.  Neurologic exam : The patient is awake and alert, oriented to place and time.   Memory subjective described as intact. There is a normal attention span & concentration ability. Speech is fluent without dysarthria, dysphonia or aphasia. Mood and affect are appropriate.  Cranial nerves: Pupils are equal and briskly reactive to light. Hearing to finger rub intact.  Facial sensation intact to fine touch. Facial motor strength is symmetric and tongue and uvula move midline. The patient's shoulder shrug is slightly weaker on the right side he keeps the right shoulder at a higher level than the left there is some pain restriction especially with posterior rotation.   Assessment:  After physical and neurologic examination, review of laboratory studies, imaging, neurophysiology testing and pre-existing records, assessment is   Miguel Reynolds does not have the usual risk factors for obstructive sleep apnea, he is neither morbidly obese, he does not have a large neck, he does not have retrognathia and his Mallampati is grade 1. The witnessed snoring and apnea seems to  have increased over the last year perhaps over the last 6 month. In addition he is a shift worker and his nocturnal sleep has not been as restful and frequently interrupted by periods of wakefulness for unknown reasons. 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  The witnessed snoring is indicative of UARS, the  witnessed apneas may be central in origin , as  associated with the taking of pain medicine. This may explain the low oxygen saturation during the ONO as well.    The patient was advised of the nature of the diagnosed sleep disorder , the treatment options and risks for general a health and wellness arising from not treating the condition. Visit duration was 25 minutes.  50% of the visit time was spent in face to face discussion. The patient's questions were answered, the differences between ENT and CPAP treatment for apnea were explained and treatment options discussed.   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 None of his jobs requires an to have long conversations/  phone conversations etc., so a tonsillectomy in adulthood is feasible.  But his tosills are not "kissing", they are enlarged and reddened. His main problem may not be the tonsils but his chronically more congested  nasal airflow. He has allergies and works outdoors with pollen, indoors with dust.  I would like for him to see an ENT physician to evaluate as this could improve with medication and also to estimate for the patient how long the recovery after a  surgical procedure would be.  I like the success he has shown with CPAP in my preference would be to continue it. I will refer him to ENT consultant. Rv in 3 month with NP      Melvyn Novas MD  03/06/2016

## 2016-06-04 ENCOUNTER — Encounter: Payer: Self-pay | Admitting: Neurology

## 2016-06-05 ENCOUNTER — Ambulatory Visit (INDEPENDENT_AMBULATORY_CARE_PROVIDER_SITE_OTHER): Payer: 59 | Admitting: Adult Health

## 2016-06-05 ENCOUNTER — Encounter: Payer: Self-pay | Admitting: Adult Health

## 2016-06-05 VITALS — BP 128/78 | HR 68 | Resp 4 | Ht 72.0 in | Wt 191.0 lb

## 2016-06-05 DIAGNOSIS — G4733 Obstructive sleep apnea (adult) (pediatric): Secondary | ICD-10-CM

## 2016-06-05 NOTE — Patient Instructions (Signed)
Continue using CPAP nightly  ENT: Dr. Haroldine Lawsrossley. [phone]

## 2016-06-05 NOTE — Progress Notes (Signed)
PATIENT: Miguel Reynolds DOB: [birth year]  REASON FOR VISIT: follow up- OSA on cpap HISTORY FROM: patient  HISTORY OF PRESENT ILLNESS: Miguel Reynolds is a 45 year old male with a history of obstructive sleep apnea on CPAP. He returns today for follow-up. His download indicates that he uses machine nightly for a compliance of 100%. On average he uses his machine 6 hours and 28 minutes. His residual AHI is 2.6 on a minimum pressure of 5 cm water with maximum pressure of 12 cm water. He does not have a significant leak. At the last visit the patient was advised to follow-up with ENT however the patient states he Reynolds a cancel his appointment and has not rescheduled. He states that he would like to be able to come off the CPAP at some point and understands the importance of following up with ENT. He returns today for an evaluation.  HISTORY per Miguel. Oliva Reynolds notes :Miguel Reynolds is a 45 y.o. male , caucasian, right handed, married male, seen here as a referral from Miguel Reynolds, [name], for a sleep medicine consultation.  Miguel Reynolds was referred for a chief complaint of witnessed snoring and witnessed apneas during sleep. Miguel Reynolds the nurse practitioner for Miguel. Phillips Reynolds signed this referral was a request to rule out sleep apnea of obstructive or central origin. He further indicated that the patient has excessive daytime fatigue Miguel Reynolds that he went to a horseshow in [place] , sharing a hotel suite with his wife and 3 other parties one of them a respiratory therapist. This respiratory therapist told him that she Reynolds witnessed 3 apneas in a short time when she observed his nocturnal sleep. Due to this observation and overnight pulse oximetry was ordered by his primary care physician, which revealed an oxygen nadir of 78% during the night. I do not have the original tracing here but apparently the results of the overnight oximetry were concerning enough to have the patient referred for a  sleep study proper. He was at another show in [place] and was again told he was snoring and apnoeic.   He works full time for [employer], [employer] and is logging and farming. 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 He was put out of work in December due to a surgery , a shoulder reconstructon.  He couldn't climb trees or chop wood for 2 month and got more sleep, but still feels unrefreshed and unrestored in the morning.   His sleep habits are as follows: He usually goes to his bedroom and to bed at around 11:30 PM the latest is midnight. He usually goes straight to bed he does not watch TV or read for long time. He falls asleep pretty promptly, and he wakes up repeatedly throughout the night. He will sleep about 2 hours en bloc before waking up but he is not sure what wakes him up. It is neither vivid dreams or nightmares it is usually not nocturia and his suspicion is that he may be related to his breathing pattern. He has never been aware of his own apneas but his wife is notching him and waking him when she witnesses apnea. He snores in any position, but is only able to sleep on his back since the shoulder surgery. He bedroom is cool, quiet and dark.  His wife normally rises at 6 AM to  get ready for work so he usually wakes up at the same time. As he is currently not working he can stay in bed until 9 or 9:30. He may get some extra hours of sleep this way too. He still does not feel refreshed or restored in the morning. While he was working he felt often so tired he would fall asleep on the passenger's seat driving 20 mile distances or less. Currently that is not the case. He does not take  any naps during the day,  and he is not as high at risk of sleeping inadvertently. He gets about 7-10  hours a day, wide variety.  He doesn't wake  with headaches but with a dry mouth.  He has a "difficult time getting up and moving  " He drinks a cup of coffee in AM, no sodas , one iced tea at lunch. Usually drinks water, No ETOH , no tobacco.  No history of TBI , broken nose or jaw.   02-22-15 Miguel Reynolds a sleep study on 11-03-14 which revealed an AHI of 20 and an RDI of 20 with a strong REM accentuation 290 AHI there was not so much positional component noted. He was titrated to CPAP and placed on AutoSet. His current CPAP allows between 5 and 12 cm water pressure with an EPR of 3. Compliance data from 02-16-15 show 100% compliance for days and 67% compliance for over 4 hours of nightly use. 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 This does not reflect his true consistent use of CPAP. He is using it consecutively for over 4 hours on average. His average user time is 5 hours 8 minutes and his residual AHI is 2.1. The 91st percentile pressure is 10.4. At this time I see no need to change any of the settings he does have minor air leaks that do not require a new mask. His Epworth sleepiness score is 3 points and his fatigue severity score is 15 points. I think he is doing very well with the current setting and I would like him to continue using this. Miguel Reynolds is currently fighting a sinus infection and I encouraged him to not use the CPAP for 3 days to allow draining the mucus. He may do well with squared phenelzine or any other Mucinex-like product and hot liquids to facilitate this process.  Interval history from 03/06/2016. I have the pleasure of seeing Miguel Reynolds today, for routine yearly follow-up on CPAP use. The patient is on an AutoSet between 5 and 12 cm water with 3 cm EPR and has a compliance of 100% with an average user time of 7 hours and 3 minutes. Residual AHI is 2.5, an excellent result. Epworth sleepiness score endorsed at 3 points, fatigue severity at 15 points. Miguel Reynolds remarkably enlarged tonsils when I initially evaluated him  and his question is if he could be successfully treating apnea and snoring by having them removed. He also has a deviated nasal septum nontraumatic. I think it would be feasible to correct both allowing him to have easy a nasal airway passage and reduce the upper airway resistance.    REVIEW OF SYSTEMS: Out of a complete 14 system review of symptoms, the patient complains only of the following symptoms, and all other reviewed systems are negative.  See history of present illness ALLERGIES: No Known Allergies  HOME MEDICATIONS: Outpatient Medications Prior  to Visit  Medication Sig Dispense Refill  . ibuprofen (ADVIL,MOTRIN) 200 MG tablet Take 800 mg by mouth every 6 (six) hours as needed. For pain     . modafinil (PROVIGIL) 200 MG tablet Take 1 tablet (200 mg total) by mouth daily. 30 tablet 5  . Multiple Vitamins-Minerals (MULTIVITAMINS THER. W/MINERALS) TABS Take 1 tablet by mouth daily.      Marland Kitchen omeprazole (PRILOSEC) 40 MG capsule Take 40 mg by mouth daily.      Marland Kitchen UNABLE TO FIND Take 1 tablet by mouth daily. Allertec      Facility-Administered Medications Prior to Visit  Medication Dose Route Frequency Provider Last Rate Last Dose  . fexofenadine (ALLEGRA) tablet 60 mg  60 mg Oral BID Melvyn Novas, MD        PAST MEDICAL HISTORY: Past Medical History:  Diagnosis Date  . GERD (gastroesophageal reflux disease)     PAST SURGICAL HISTORY: Past Surgical History:  Procedure Laterality Date  . FINGER SURGERY     pins  . SHOULDER SURGERY Right     FAMILY HISTORY: Family History  Problem Relation Age of Onset  . Colon cancer Neg Hx   . Colon polyps Neg Hx     SOCIAL HISTORY: Social History   Social History  . Marital status: Married    Spouse name: N/A  . Number of children: 0  . Years of education: college   Occupational History  . Curator    Social History Main Topics  . Smoking status: Never Smoker  . Smokeless tobacco: Never Used  . Alcohol use 1.8 oz/week     3 Cans of beer per week     Comment: rarely  . Drug use: No  . Sexual activity: Not on file   Other Topics Concern  . Not on file   Social History Narrative   Patient lives at home with his wife [name]).    Patient works full time at [name] , and he is a Visual merchandiser.   Education college.   Right handed.   Caffeine one cup daily coffee.       PHYSICAL EXAM  Vitals:   06/05/16 1136  BP: 128/78  Pulse: 68  Resp: (!) 4  Weight: 191 lb (86.6 kg)  Height: 6' (1.829 m)   Body mass index is 25.9 kg/m.  Generalized: Well developed, in no acute distress   Neurological examination  Mentation: Alert oriented to time, place, history taking. Follows all commands speech and language fluent Cranial nerve II-XII: Pupils were equal round reactive to light. Extraocular movements were full, visual field were full on confrontational test. Facial sensation and strength were normal. Uvula tongue midline. Head turning and shoulder shrug  were normal and symmetric. Motor: The motor testing reveals 5 over 5 strength of all 4 extremities. Good symmetric motor tone is noted throughout.  Sensory: Sensory testing is intact to soft touch on all 4 extremities. No evidence of extinction is noted.  Coordination: Cerebellar testing reveals good finger-nose-finger and heel-to-shin bilaterally.  Gait and station: Gait is normal. Tandem gait is normal. Romberg is negative. No drift is seen.  Reflexes: Deep tendon reflexes are symmetric and normal bilaterally.   DIAGNOSTIC DATA (LABS, IMAGING, TESTING) - I reviewed patient records, labs, notes, testing and imaging myself where available.    ASSESSMENT AND PLAN 45 y.o. year old male  has a past medical history of GERD (gastroesophageal reflux disease). here with:  1. Obstructive sleep apnea on CPAP  Download  shows excellent compliance. Patient encouraged to continue using CPAP nightly. Advised that he should follow-up with ENT. Patient  verbalized understanding. He will follow-up in 6 months or sooner if needed.     Butch PennyMegan Rowland Ericsson, MSN, NP-C 06/05/2016, 11:53 AM [facility] [address], [address] [place], [place] [address] [phone]

## 2016-06-05 NOTE — Progress Notes (Signed)
I agree with the assessment and plan as directed by NP .The patient is known to me .   Deavin Forst, MD  

## 2016-10-18 ENCOUNTER — Telehealth: Payer: Self-pay

## 2016-10-18 NOTE — Telephone Encounter (Signed)
I received a form from [facility] requesting OV notes, cpap compliance log, and sleep study results for this pt with a physician note and Dr. Oliva Bustardohmeier's signature for this pt. Dr. Vickey Hugerohmeier signed this form, requested documentation attached. I'm not sure if pt has signed a medical release form for this though, so before sending, I will ask our medical records department to investigate and obtain a release if needed, before faxing it.

## 2016-12-04 ENCOUNTER — Ambulatory Visit: Payer: 59 | Admitting: Adult Health

## 2017-01-18 ENCOUNTER — Ambulatory Visit: Payer: 59 | Admitting: Adult Health

## 2017-10-26 DIAGNOSIS — S39012A Strain of muscle, fascia and tendon of lower back, initial encounter: Secondary | ICD-10-CM | POA: Diagnosis not present

## 2017-10-26 DIAGNOSIS — Z6823 Body mass index (BMI) 23.0-23.9, adult: Secondary | ICD-10-CM | POA: Diagnosis not present

## 2017-10-31 DIAGNOSIS — G4733 Obstructive sleep apnea (adult) (pediatric): Secondary | ICD-10-CM | POA: Diagnosis not present

## 2018-06-20 DIAGNOSIS — R1312 Dysphagia, oropharyngeal phase: Secondary | ICD-10-CM | POA: Diagnosis not present

## 2018-06-20 DIAGNOSIS — J351 Hypertrophy of tonsils: Secondary | ICD-10-CM | POA: Diagnosis not present

## 2018-06-20 DIAGNOSIS — G4733 Obstructive sleep apnea (adult) (pediatric): Secondary | ICD-10-CM | POA: Diagnosis not present

## 2018-07-16 ENCOUNTER — Encounter (INDEPENDENT_AMBULATORY_CARE_PROVIDER_SITE_OTHER): Payer: Self-pay | Admitting: Internal Medicine

## 2018-07-16 ENCOUNTER — Ambulatory Visit (INDEPENDENT_AMBULATORY_CARE_PROVIDER_SITE_OTHER): Payer: 59 | Admitting: Internal Medicine

## 2018-07-16 VITALS — BP 90/62 | HR 88 | Temp 98.5°F | Ht 72.0 in | Wt 182.2 lb

## 2018-07-16 DIAGNOSIS — K219 Gastro-esophageal reflux disease without esophagitis: Secondary | ICD-10-CM

## 2018-07-16 DIAGNOSIS — R1310 Dysphagia, unspecified: Secondary | ICD-10-CM | POA: Diagnosis not present

## 2018-07-16 DIAGNOSIS — R1319 Other dysphagia: Secondary | ICD-10-CM

## 2018-07-16 MED ORDER — OMEPRAZOLE 40 MG PO CPDR: 40.0000 mg | DELAYED_RELEASE_CAPSULE | Freq: Every day | ORAL | 11 refills | Status: DC

## 2018-07-16 NOTE — Patient Instructions (Signed)
The risks of bleeding, perforation and infection were reviewed with patient.  

## 2018-07-16 NOTE — Progress Notes (Signed)
   Subjective:    Patient ID: [facility], male    DOB: Mar 07, 1971, 47 y.o.   MRN: [id number]  HPI Referred by Dr. Phillips OdorGolding for dysphagia.Symptoms for about a year. Breads are had to swallow. States his dysphagia does not occur on a daily basis.  He has to make himself burp for the bolus to go down. No trouble eating meats unless there is bread wrapped around it. His appetite is good. No weight loss. BMs move normal. Occasionally sees blood. Thinks he may have a hemorrhoid. No abdominal pain. Does have GERD and take Omeprazole for this.    Hx of same and underwent and EGD/ED back in 2012 by Dr. Jena Gaussourk which revealed patulous EG junction. Satus post passage of a maloney dilator. Small hiatal hernia, otherwise normal D1 and D2.    Self employed tree service. Married  No children.   Review of Systems Past Medical History:  Diagnosis Date  . Dysphagia   . GERD (gastroesophageal reflux disease)     Past Surgical History:  Procedure Laterality Date  . FINGER SURGERY     pins  . SHOULDER SURGERY Right     No Known Allergies  Current Outpatient Medications on File Prior to Visit  Medication Sig Dispense Refill  . cetirizine (KLS ALLER-TEC) 10 MG tablet Take 10 mg by mouth daily.    Marland Kitchen. ibuprofen (ADVIL,MOTRIN) 200 MG tablet Take 800 mg by mouth every 6 (six) hours as needed. For pain     . Multiple Vitamins-Minerals (MULTIVITAMINS THER. W/MINERALS) TABS Take 1 tablet by mouth daily.       No current facility-administered medications on file prior to visit.         No Known Allergies        Objective:   Physical Exam Blood pressure 90/62, pulse 88, temperature 98.5 F (36.9 C), height 6' (1.829 m), weight 182 lb 3.2 oz (82.6 kg). Alert and oriented. Skin warm and dry. Oral mucosa is moist.   . Sclera anicteric, conjunctivae is pink. Thyroid not enlarged. No cervical lymphadenopathy. Lungs clear. Heart regular rate and rhythm.  Abdomen is soft. Bowel sounds are positive.  No hepatomegaly. No abdominal masses felt. No tenderness.  No edema to lower extremities.           Assessment & Plan:  Dysphagia. Stricture needs to be ruled out.  EGD/ED with propofol.  The risks of bleeding, perforation and infection were reviewed with patient. GERD: Rx for Omeprazole 40mg  sent to his pharmacy.

## 2018-07-17 ENCOUNTER — Other Ambulatory Visit (INDEPENDENT_AMBULATORY_CARE_PROVIDER_SITE_OTHER): Payer: Self-pay | Admitting: Internal Medicine

## 2018-07-17 ENCOUNTER — Telehealth (INDEPENDENT_AMBULATORY_CARE_PROVIDER_SITE_OTHER): Payer: Self-pay | Admitting: *Deleted

## 2018-07-17 DIAGNOSIS — R1310 Dysphagia, unspecified: Secondary | ICD-10-CM

## 2018-07-17 NOTE — Telephone Encounter (Signed)
Called patient to schedule EGD/ED w propofol on 08/09/18, he will be out of town last 2 weeks ov the month and will call me back after 1st of year once he gets insurance settled

## 2018-07-18 NOTE — Telephone Encounter (Signed)
noted 

## 2018-08-01 ENCOUNTER — Other Ambulatory Visit: Payer: Self-pay | Admitting: Otolaryngology

## 2018-08-01 ENCOUNTER — Ambulatory Visit (INDEPENDENT_AMBULATORY_CARE_PROVIDER_SITE_OTHER): Payer: 59 | Admitting: Otolaryngology

## 2018-08-01 DIAGNOSIS — J351 Hypertrophy of tonsils: Secondary | ICD-10-CM | POA: Diagnosis not present

## 2018-08-01 DIAGNOSIS — R1310 Dysphagia, unspecified: Secondary | ICD-10-CM

## 2018-08-01 DIAGNOSIS — K219 Gastro-esophageal reflux disease without esophagitis: Secondary | ICD-10-CM | POA: Diagnosis not present

## 2018-08-01 DIAGNOSIS — R1312 Dysphagia, oropharyngeal phase: Secondary | ICD-10-CM

## 2018-08-01 DIAGNOSIS — G4733 Obstructive sleep apnea (adult) (pediatric): Secondary | ICD-10-CM | POA: Diagnosis not present

## 2018-08-05 ENCOUNTER — Other Ambulatory Visit (HOSPITAL_COMMUNITY): Payer: 59

## 2018-08-09 ENCOUNTER — Encounter (HOSPITAL_COMMUNITY): Payer: Self-pay

## 2018-08-09 ENCOUNTER — Ambulatory Visit (HOSPITAL_COMMUNITY): Admit: 2018-08-09 | Payer: 59 | Admitting: Internal Medicine

## 2018-08-09 SURGERY — ESOPHAGOGASTRODUODENOSCOPY (EGD) WITH PROPOFOL
Anesthesia: Monitor Anesthesia Care

## 2018-09-05 ENCOUNTER — Ambulatory Visit (INDEPENDENT_AMBULATORY_CARE_PROVIDER_SITE_OTHER): Payer: 59 | Admitting: Otolaryngology

## 2019-08-06 ENCOUNTER — Telehealth (INDEPENDENT_AMBULATORY_CARE_PROVIDER_SITE_OTHER): Payer: Self-pay | Admitting: Gastroenterology

## 2019-08-06 DIAGNOSIS — K219 Gastro-esophageal reflux disease without esophagitis: Secondary | ICD-10-CM

## 2019-08-06 MED ORDER — OMEPRAZOLE 40 MG PO CPDR: 40.0000 mg | DELAYED_RELEASE_CAPSULE | Freq: Every day | ORAL | 1 refills | Status: DC

## 2019-08-06 NOTE — Telephone Encounter (Signed)
Received refill request for omeprazole and will refill.    patient last seen December 2019 time of endoscopy.  Will need a yearly follow-up within the next 1 to 2 months.  Please schedule, thanks.

## 2019-10-01 ENCOUNTER — Ambulatory Visit (INDEPENDENT_AMBULATORY_CARE_PROVIDER_SITE_OTHER): Payer: Self-pay | Admitting: Gastroenterology

## 2019-10-08 ENCOUNTER — Ambulatory Visit: Payer: Self-pay | Attending: Internal Medicine

## 2019-10-08 ENCOUNTER — Other Ambulatory Visit: Payer: Self-pay

## 2019-10-08 DIAGNOSIS — Z20822 Contact with and (suspected) exposure to covid-19: Secondary | ICD-10-CM

## 2019-10-08 DIAGNOSIS — U071 COVID-19: Secondary | ICD-10-CM | POA: Insufficient documentation

## 2019-10-09 LAB — NOVEL CORONAVIRUS, NAA: SARS-CoV-2, NAA: DETECTED — AB

## 2019-10-20 ENCOUNTER — Other Ambulatory Visit (INDEPENDENT_AMBULATORY_CARE_PROVIDER_SITE_OTHER): Payer: Self-pay | Admitting: *Deleted

## 2019-10-20 DIAGNOSIS — K219 Gastro-esophageal reflux disease without esophagitis: Secondary | ICD-10-CM

## 2019-10-20 MED ORDER — OMEPRAZOLE 40 MG PO CPDR: 40.0000 mg | DELAYED_RELEASE_CAPSULE | Freq: Every day | ORAL | 1 refills | Status: DC

## 2020-01-18 ENCOUNTER — Other Ambulatory Visit (INDEPENDENT_AMBULATORY_CARE_PROVIDER_SITE_OTHER): Payer: Self-pay | Admitting: Internal Medicine

## 2020-01-18 DIAGNOSIS — K219 Gastro-esophageal reflux disease without esophagitis: Secondary | ICD-10-CM

## 2020-01-19 NOTE — Telephone Encounter (Signed)
Last seen 2019. Overdue for yearly OV. Please call to schedule. 1 refill sent to [facility]

## 2020-03-10 ENCOUNTER — Ambulatory Visit (INDEPENDENT_AMBULATORY_CARE_PROVIDER_SITE_OTHER): Payer: Self-pay | Admitting: Gastroenterology

## 2020-04-16 ENCOUNTER — Other Ambulatory Visit (INDEPENDENT_AMBULATORY_CARE_PROVIDER_SITE_OTHER): Payer: Self-pay | Admitting: Gastroenterology

## 2020-04-16 DIAGNOSIS — K219 Gastro-esophageal reflux disease without esophagitis: Secondary | ICD-10-CM

## 2020-05-10 ENCOUNTER — Ambulatory Visit (INDEPENDENT_AMBULATORY_CARE_PROVIDER_SITE_OTHER): Payer: Self-pay | Admitting: Gastroenterology

## 2020-05-10 ENCOUNTER — Other Ambulatory Visit: Payer: Self-pay

## 2020-05-10 ENCOUNTER — Encounter (INDEPENDENT_AMBULATORY_CARE_PROVIDER_SITE_OTHER): Payer: Self-pay | Admitting: Gastroenterology

## 2020-05-10 DIAGNOSIS — K219 Gastro-esophageal reflux disease without esophagitis: Secondary | ICD-10-CM

## 2020-05-10 MED ORDER — OMEPRAZOLE 40 MG PO CPDR: 40.0000 mg | DELAYED_RELEASE_CAPSULE | Freq: Every day | ORAL | 3 refills | Status: DC

## 2020-05-10 MED ORDER — FAMOTIDINE 20 MG PO TABS: 20.0000 mg | ORAL_TABLET | Freq: Every day | ORAL | 11 refills | Status: DC

## 2020-05-10 NOTE — Progress Notes (Signed)
Patient profile: Miguel Reynolds is a 49 y.o. male seen for evaluation of follow-up of GERD.  He was last seen in clinic 2019.  History of Present Illness: Miguel Reynolds is seen today for follow-up.  He reports overall GERD symptoms are fairly well controlled, he does supplement with Tums on occasion throughout the day, he generally takes omeprazole 40 mg once a day in the morning.  He is extremely symptomatic if he misses 2 to 3 days of omeprazole.  As long as he takes omeprazole he has not had return of dysphagia.  He denies any nausea vomiting.  No epigastric pain.  He does not take NSAIDs.  He has very rare alcohol use.  He does not smoke.  He denies any bowel habit changes-no constipation, diarrhea, melena, rectal bleeding.  No family history of colon polyps colon cancer.  Wt Readings from Last 3 Encounters:  05/10/20 193 lb (87.5 kg)  07/16/18 182 lb 3.2 oz (82.6 kg)  06/05/16 191 lb (86.6 kg)     Last Colonoscopy: None prior Last Endoscopy: 2012-patulous EG junction. Satus post passage of a maloney dilator. Small hiatal hernia, otherwise normal D1 and D2.    Past Medical History:  Past Medical History:  Diagnosis Date  . Dysphagia   . GERD (gastroesophageal reflux disease)     Problem List: Patient Active Problem List   Diagnosis Date Noted  . Dysphagia 07/17/2018  . Congenital nasal septum deviation 03/06/2016  . OSA on CPAP 03/06/2016  . 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 Snorings 10/14/2014  . UARS (upper airway resistance syndrome) 10/14/2014  . 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 GERD (gastroesophageal reflux disease) 06/22/2011  . Dysphagia 06/22/2011    Past Surgical History: Past Surgical History:  Procedure Laterality Date  . FINGER SURGERY     pins  . SHOULDER SURGERY Right     Allergies: No Known Allergies    Home Medications:  Current Outpatient Medications:  .  cetirizine (KLS ALLER-TEC) 10 MG tablet, Take 10 mg by mouth daily., Disp: , Rfl:    .  ibuprofen (ADVIL,MOTRIN) 200 MG tablet, Take 800 mg by mouth every 6 (six) hours as needed. For pain , Disp: , Rfl:  .  Multiple Vitamins-Minerals (MULTIVITAMINS THER. W/MINERALS) TABS, Take 1 tablet by mouth daily.  , Disp: , Rfl:  .  omeprazole (PRILOSEC) 40 MG capsule, Take 1 capsule (40 mg total) by mouth daily. Take 30 minutes before breakfast, Disp: 90 capsule, Rfl: 3 .  famotidine (PEPCID) 20 MG tablet, Take 1 tablet (20 mg total) by mouth daily. in evenings., Disp: 30 tablet, Rfl: 11   Family History: No GI family history   Social History:   reports that he has never smoked. He has never used smokeless tobacco. He reports current alcohol use of about 3.0 standard drinks of alcohol per week. He reports that he does not use drugs.   Review of Systems: Constitutional: Denies weight loss/weight gain  Eyes: No changes in vision. ENT: No oral lesions, sore throat.  GI: see HPI.  Heme/Lymph: No easy bruising.  CV: No chest pain.  GU: No hematuria.  Integumentary: No rashes.  Neuro: No headaches.  Psych: No depression/anxiety.  Endocrine: No heat/cold intolerance.  Allergic/Immunologic: No urticaria.  Resp: No cough, SOB.  Musculoskeletal: No joint swelling.    Physical Examination: BP 136/89 (BP Location: Right Arm, Patient Position: Sitting, Cuff Size: Normal)   Pulse 69   Temp (!) 96.9 F (36.1 C) (Temporal)  Ht 6' (1.829 m)   Wt 193 lb (87.5 kg)   BMI 26.18 kg/m  Gen: NAD, alert and oriented x 4 HEENT: PEERLA, EOMI, Neck: supple, no JVD Chest: CTA bilaterally, no wheezes, crackles, or other adventitious sounds CV: RRR, no m/g/c/r Abd: soft, NT, ND, +BS in all four quadrants; no HSM, guarding, ridigity, or rebound tenderness Ext: no edema, well perfused with 2+ pulses, Skin: no rash or lesions noted on observed skin Lymph: no noted LAD  Data Reviewed:     Assessment/Plan: Miguel Reynolds is a 49 y.o. male    Calil was seen today for gastroesophageal  reflux.  Diagnoses and all orders for this visit:  Gastroesophageal reflux disease without esophagitis -     omeprazole (PRILOSEC) 40 MG capsule; Take 1 capsule (40 mg total) by mouth daily. Take 30 minutes before breakfast  Other orders -     famotidine (PEPCID) 20 MG tablet; Take 1 tablet (20 mg total) by mouth daily. in evenings.  1. GERD - generally well controlled on Prilosec 40 mg daily.  He does have some occasional breakthrough symptoms and will give him Pepcid to use in evenings if needed.  He did not have Barrett's esophagus on his EGD 2012, could consider repeat EGD when he has a colonoscopy in future.  Diet modifications reviewed.  He has not had any blood work in about 2 years.  He currently does not have insurance but started a job and will get insurance December 2021.  Recommended he see PCP at that time for his yearly labs.   Follow-up 1 year-call sooner if needed    I personally performed the service, non-incident to. (WP)  Tawni Pummel, [facility]

## 2020-05-10 NOTE — Patient Instructions (Signed)
GERD instructions: -Please avoid lying flat within 2 to 3 hours of eating, this will make reflux symptoms worse. -Some patients find elevating the head of the bed beneficial. -Avoid spicy greasy foods as well as caffeine, coffee, sodas-these food/drinks can worsen heartburn and reflux. -Tobacco will worsen reflux, please try to decrease/eliminate tobacco intake if applicable. -Avoid NSAID products (ibuprofen, aspirin, Advil, Aleve, Goody's, BCs, Alka-Seltzer) - if needing these occasionally please try to take with meal or snack to decrease stomach irritation. -If taking medication for reflux such as prilosec, nexium, aciphex, dexilant, prevacid - take 20-30 minutes before a meal for maximum effectiveness.     Refill sent of omeprazole to pharmacy. Can add pepcid before supper or bedtime as needed for symptoms.

## 2020-07-13 ENCOUNTER — Telehealth (INDEPENDENT_AMBULATORY_CARE_PROVIDER_SITE_OTHER): Payer: Self-pay

## 2020-07-13 NOTE — Telephone Encounter (Signed)
Received approval from Sarah to accept patient as a new patient. 

## 2020-07-20 ENCOUNTER — Ambulatory Visit (INDEPENDENT_AMBULATORY_CARE_PROVIDER_SITE_OTHER): Payer: 59 | Admitting: Nurse Practitioner

## 2020-07-20 ENCOUNTER — Other Ambulatory Visit: Payer: Self-pay

## 2020-07-20 ENCOUNTER — Encounter (INDEPENDENT_AMBULATORY_CARE_PROVIDER_SITE_OTHER): Payer: Self-pay | Admitting: Nurse Practitioner

## 2020-07-20 VITALS — BP 128/84 | HR 77 | Temp 98.0°F | Ht 72.5 in | Wt 194.8 lb

## 2020-07-20 DIAGNOSIS — Z1211 Encounter for screening for malignant neoplasm of colon: Secondary | ICD-10-CM

## 2020-07-20 DIAGNOSIS — L989 Disorder of the skin and subcutaneous tissue, unspecified: Secondary | ICD-10-CM | POA: Diagnosis not present

## 2020-07-20 DIAGNOSIS — G4733 Obstructive sleep apnea (adult) (pediatric): Secondary | ICD-10-CM

## 2020-07-20 DIAGNOSIS — K219 Gastro-esophageal reflux disease without esophagitis: Secondary | ICD-10-CM

## 2020-07-20 DIAGNOSIS — R5383 Other fatigue: Secondary | ICD-10-CM | POA: Diagnosis not present

## 2020-07-20 DIAGNOSIS — R41840 Attention and concentration deficit: Secondary | ICD-10-CM | POA: Diagnosis not present

## 2020-07-20 DIAGNOSIS — Z13220 Encounter for screening for lipoid disorders: Secondary | ICD-10-CM

## 2020-07-20 DIAGNOSIS — Z131 Encounter for screening for diabetes mellitus: Secondary | ICD-10-CM

## 2020-07-20 DIAGNOSIS — Z9989 Dependence on other enabling machines and devices: Secondary | ICD-10-CM

## 2020-07-20 MED ORDER — OMEPRAZOLE 40 MG PO CPDR: 40.0000 mg | DELAYED_RELEASE_CAPSULE | Freq: Every day | ORAL | 0 refills | Status: DC

## 2020-07-20 NOTE — Progress Notes (Signed)
Subjective:  Patient ID: Miguel Reynolds, male    DOB: 08-05-1971  Age: 49 y.o. MRN: [id number]  CC:  Chief Complaint  Patient presents with  . Establish Care    wants to stay healthy, needs dermatology referral      HPI  This patient arrives today to establish care.  He tells me he is here to establish care as he recently was able to get healthcare insurance coverage.  He would like to complete annual physical exam as he has not had one completed in the last 3 to 4 years.  He does have a few concerns today including a skin lesion, difficulty with focus and concentration, overall health maintenance of wellbeing, and he needs a new part for his CPAP machine.  He has noticed a scab to his left lower extremity that has been present for approximately 3 weeks and tells me its not been healing.  This is concerning to him.  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  He would like to be evaluated by dermatology regarding the skin lesion.  He also mentioned that he has a hard time with focus and concentration.  He often gets easily sidetracked and has a hard time completing tasks before starting a new one.  He believes he may have some component of ADD and would like to be evaluated and treated for this.  We did discuss colon cancer screening as he is now 49 years of age.  As far as he knows he does not have any family history of colon cancer or any personal history of polyps however he is never undergone diagnostic colonoscopy in the past.  He also tells me that he does not know much about the medical history on his mother side.  He would like to discuss colon cancer screening and is open to doing colonoscopy.  He does not want to have flu shot administered at this time.  He has a history of obstructive sleep apnea and does wear CPAP at night.  He tells me that a part of the machine broke, and he tried to buy this part medical equipment  store but they would not provide it to him without a prescription.  He is not sure how old his CPAP machine is but thinks it may be over 23 years old.  He also mentions to me that he has a history of GERD.  He is taking famotidine to treat this but feels that his symptoms are not well controlled.  He tells me he was on omeprazole in the past and would like to restart this medication.  Tells me he was taking 40 mg previously.  Past Medical History:  Diagnosis Date  . Dysphagia   . GERD (gastroesophageal reflux disease)       Family History  Problem Relation Age of Onset  . Colon cancer Neg Hx   . Colon polyps Neg Hx     Social History   Social History Narrative   Patient lives at home with his wife Shirlean Mylar).    Patient works full time at [employer] , and he is a Psychologist, sport and exercise.   Education college.   Right handed.   Caffeine one cup daily coffee.    Social History   Tobacco Use  . Smoking status: Never Smoker  . Smokeless tobacco: Never Used  Substance Use Topics  . Alcohol use: Yes  Alcohol/week: 3.0 standard drinks    Types: 3 Cans of beer per week    Comment: rarely     Current Meds  Medication Sig  . cetirizine (KLS ALLER-TEC) 10 MG tablet Take 10 mg by mouth daily.  Marland Kitchen ibuprofen (ADVIL,MOTRIN) 200 MG tablet Take 800 mg by mouth every 6 (six) hours as needed. For pain   . Multiple Vitamins-Minerals (MULTIVITAMINS THER. W/MINERALS) TABS Take 1 tablet by mouth daily.    Marland Kitchen omeprazole (PRILOSEC) 40 MG capsule Take 1 capsule (40 mg total) by mouth daily. Take 30 minutes before breakfast  . [DISCONTINUED] famotidine (PEPCID) 20 MG tablet Take 1 tablet (20 mg total) by mouth daily. in evenings.  . [DISCONTINUED] omeprazole (PRILOSEC) 40 MG capsule Take 1 capsule (40 mg total) by mouth daily. Take 30 minutes before breakfast    ROS:  Review of Systems  Constitutional: Positive for malaise/fatigue. Negative for fever and weight loss.  Respiratory: Positive for cough  (seasonal allergies). Negative for shortness of breath.   Cardiovascular: Negative for chest pain and palpitations.  Gastrointestinal: Positive for heartburn. Negative for blood in stool.  Neurological: Negative for dizziness and headaches.     Objective:   Today's Vitals: BP 128/84   Pulse 77   Temp 98 F (36.7 C) (Temporal)   Ht 6' 0.5" (1.842 m)   Wt 194 lb 12.8 oz (88.4 kg)   SpO2 96%   BMI 26.06 kg/m  Vitals with BMI 07/20/2020 05/10/2020 07/16/2018  Height 6' 0.5" 6' 0"  6' 0"   Weight 194 lbs 13 oz 193 lbs 182 lbs 3 oz  BMI 26.04 07.37 10.62  Systolic 694 854 90  Diastolic 84 89 62  Pulse 77 69 88     Physical Exam Vitals reviewed.  Constitutional:      Appearance: Normal appearance.  HENT:     Head: Normocephalic and atraumatic.  Cardiovascular:     Rate and Rhythm: Normal rate and regular rhythm.  Pulmonary:     Effort: Pulmonary effort is normal.     Breath sounds: Normal breath sounds.  Musculoskeletal:     Cervical back: Neck supple.  Skin:    General: Skin is warm and dry.       Neurological:     Mental Status: He is alert and oriented to person, place, and time.  Psychiatric:        Mood and Affect: Mood normal.        Behavior: Behavior normal.        Thought Content: Thought content normal.        Judgment: Judgment normal.          Assessment and Plan   1. Skin lesion   2. Gastroesophageal reflux disease without esophagitis   3. Fatigue, unspecified type   4. Concentration deficit   5. Diabetes mellitus screening   6. Screening for lipid disorders   7. OSA on CPAP   8. Colon cancer screening      Plan: 1.  We will order referral to dermatology for assistance with evaluating and managing his concerns regarding the skin lesion. 2.  Will take famotidine off medication list and will reorder omeprazole.  3.-6.  We will collect blood work for further evaluation today.  We did briefly discuss bioidentical hormone replacement therapy and  the role it can play in difficulties with concentration.  We will check testosterone levels today to determine if he could potentially benefit from supplementation.  We also discussed being evaluated by  psychiatry to determine if he could benefit from medication management, he would prefer to first check his hormone levels and discuss this option further based on blood work results before being evaluated by psychiatry. 7.  We will refer him to sleep medicine for further evaluation and management of his obstructive sleep apnea.  He may benefit from repeat sleep study to determine if his CPAP settings are still adequate and to get new CPAP machine if it is damaged and/or out of date. 8.  We will refer him back to gastroenterology to discuss colon cancer screening options.  Tests ordered Orders Placed This Encounter  Procedures  . CBC with Differential/Platelets  . CMP with eGFR([facility])  . Lipid Panel  . Hemoglobin A1c  . TSH  . T4, Free  . Testosterone Total,Free,Bio, Males  . T3, Free  . Ambulatory referral to Dermatology  . Ambulatory referral to Sleep Studies  . Ambulatory referral to Gastroenterology      Meds ordered this encounter  Medications  . omeprazole (PRILOSEC) 40 MG capsule    Sig: Take 1 capsule (40 mg total) by mouth daily. Take 30 minutes before breakfast    Dispense:  90 capsule    Refill:  0    Order Specific Question:   Supervising Provider    Answer:   Doree Albee [[id number]]    Patient to follow-up in 1 month or sooner as needed for his annual physical exam, and to discuss attention and concentration difficulties with Dr. Anastasio Champion.  Ailene Ards, NP

## 2020-07-21 ENCOUNTER — Other Ambulatory Visit (INDEPENDENT_AMBULATORY_CARE_PROVIDER_SITE_OTHER): Payer: Self-pay | Admitting: Nurse Practitioner

## 2020-07-21 ENCOUNTER — Encounter (INDEPENDENT_AMBULATORY_CARE_PROVIDER_SITE_OTHER): Payer: Self-pay | Admitting: *Deleted

## 2020-07-21 DIAGNOSIS — E785 Hyperlipidemia, unspecified: Secondary | ICD-10-CM

## 2020-07-21 DIAGNOSIS — R5383 Other fatigue: Secondary | ICD-10-CM

## 2020-07-21 DIAGNOSIS — R41840 Attention and concentration deficit: Secondary | ICD-10-CM

## 2020-07-21 DIAGNOSIS — R7989 Other specified abnormal findings of blood chemistry: Secondary | ICD-10-CM

## 2020-07-21 LAB — TSH: TSH: 0.77 mIU/L (ref 0.40–4.50)

## 2020-07-21 LAB — COMPLETE METABOLIC PANEL WITH GFR
AG Ratio: 1.5 (calc) (ref 1.0–2.5)
ALT: 58 U/L — ABNORMAL HIGH (ref 9–46)
AST: 41 U/L — ABNORMAL HIGH (ref 10–40)
Albumin: 4.5 g/dL (ref 3.6–5.1)
Alkaline phosphatase (APISO): 72 U/L (ref 36–130)
BUN: 21 mg/dL (ref 7–25)
CO2: 27 mmol/L (ref 20–32)
Calcium: 9.9 mg/dL (ref 8.6–10.3)
Chloride: 105 mmol/L (ref 98–110)
Creat: 1.27 mg/dL (ref 0.60–1.35)
GFR, Est African American: 77 mL/min/{1.73_m2} (ref >=60)
GFR, Est Non African American: 67 mL/min/{1.73_m2} (ref >=60)
Globulin: 3.1 g/dL (calc) (ref 1.9–3.7)
Glucose, Bld: 80 mg/dL (ref 65–99)
Potassium: 5.1 mmol/L (ref 3.5–5.3)
Sodium: 142 mmol/L (ref 135–146)
Total Bilirubin: 0.6 mg/dL (ref 0.2–1.2)
Total Protein: 7.6 g/dL (ref 6.1–8.1)

## 2020-07-21 LAB — TESTOSTERONE TOTAL,FREE,BIO, MALES
Albumin: 4.5 g/dL (ref 3.6–5.1)
Sex Hormone Binding: 8 nmol/L — ABNORMAL LOW (ref 10–50)
Testosterone: 98 ng/dL — ABNORMAL LOW (ref 250–827)

## 2020-07-21 LAB — CBC WITH DIFFERENTIAL/PLATELET
Absolute Monocytes: 824 cells/uL (ref 200–950)
Basophils Absolute: 31 cells/uL (ref 0–200)
Basophils Relative: 0.4 %
Eosinophils Absolute: 100 cells/uL (ref 15–500)
Eosinophils Relative: 1.3 %
HCT: 52.3 % — ABNORMAL HIGH (ref 38.5–50.0)
Hemoglobin: 17.4 g/dL — ABNORMAL HIGH (ref 13.2–17.1)
Lymphs Abs: 1502 cells/uL (ref 850–3900)
MCH: 28.8 pg (ref 27.0–33.0)
MCHC: 33.3 g/dL (ref 32.0–36.0)
MCV: 86.6 fL (ref 80.0–100.0)
MPV: 9.5 fL (ref 7.5–12.5)
Monocytes Relative: 10.7 %
Neutro Abs: 5244 cells/uL (ref 1500–7800)
Neutrophils Relative %: 68.1 %
Platelets: 394 10*3/uL (ref 140–400)
RBC: 6.04 10*6/uL — ABNORMAL HIGH (ref 4.20–5.80)
RDW: 13.2 % (ref 11.0–15.0)
Total Lymphocyte: 19.5 %
WBC: 7.7 10*3/uL (ref 3.8–10.8)

## 2020-07-21 LAB — LIPID PANEL
Cholesterol: 208 mg/dL — ABNORMAL HIGH (ref <200)
HDL: 24 mg/dL — ABNORMAL LOW (ref >=40)
LDL Cholesterol (Calc): 145 mg/dL (calc) — ABNORMAL HIGH
Non-HDL Cholesterol (Calc): 184 mg/dL (calc) — ABNORMAL HIGH (ref <130)
Total CHOL/HDL Ratio: 8.7 (calc) — ABNORMAL HIGH (ref <5.0)
Triglycerides: 243 mg/dL — ABNORMAL HIGH (ref <150)

## 2020-07-21 LAB — HEMOGLOBIN A1C
Hgb A1c MFr Bld: 5.2 % of total Hgb (ref <5.7)
Mean Plasma Glucose: 103 mg/dL
eAG (mmol/L): 5.7 mmol/L

## 2020-07-21 LAB — T4, FREE: Free T4: 1.1 ng/dL (ref 0.8–1.8)

## 2020-07-21 LAB — T3, FREE: T3, Free: 4.1 pg/mL (ref 2.3–4.2)

## 2020-07-22 ENCOUNTER — Other Ambulatory Visit: Payer: Self-pay

## 2020-07-22 ENCOUNTER — Other Ambulatory Visit (INDEPENDENT_AMBULATORY_CARE_PROVIDER_SITE_OTHER): Payer: 59

## 2020-07-23 LAB — PSA, TOTAL AND FREE
PSA, % Free: 50 % (calc) (ref >25)
PSA, Free: 0.2 ng/mL
PSA, Total: 0.4 ng/mL (ref <=4.0)

## 2020-07-23 LAB — PROLACTIN: Prolactin: 14.4 ng/mL (ref 2.0–18.0)

## 2020-08-03 ENCOUNTER — Encounter (INDEPENDENT_AMBULATORY_CARE_PROVIDER_SITE_OTHER): Payer: Self-pay | Admitting: Nurse Practitioner

## 2020-09-22 ENCOUNTER — Ambulatory Visit (INDEPENDENT_AMBULATORY_CARE_PROVIDER_SITE_OTHER): Payer: 59 | Admitting: Internal Medicine

## 2020-09-22 ENCOUNTER — Encounter (INDEPENDENT_AMBULATORY_CARE_PROVIDER_SITE_OTHER): Payer: Self-pay | Admitting: Internal Medicine

## 2020-09-22 ENCOUNTER — Other Ambulatory Visit: Payer: Self-pay

## 2020-09-22 VITALS — BP 114/72 | HR 83 | Temp 98.2°F | Ht 72.0 in | Wt 198.0 lb

## 2020-09-22 DIAGNOSIS — R748 Abnormal levels of other serum enzymes: Secondary | ICD-10-CM

## 2020-09-22 DIAGNOSIS — R7989 Other specified abnormal findings of blood chemistry: Secondary | ICD-10-CM | POA: Diagnosis not present

## 2020-09-22 DIAGNOSIS — E785 Hyperlipidemia, unspecified: Secondary | ICD-10-CM

## 2020-09-22 DIAGNOSIS — R41840 Attention and concentration deficit: Secondary | ICD-10-CM | POA: Diagnosis not present

## 2020-09-22 MED ORDER — TESTOSTERONE CYPIONATE 200 MG/ML IM SOLN: 100.0000 mg | INTRAMUSCULAR | 0 refills | Status: DC

## 2020-09-22 NOTE — Patient Instructions (Signed)
[facility] Dietary Recommendations for Weight Loss What to Avoid . Avoid added sugars o Often added sugar can be found in processed foods such as many condiments, dry cereals, cakes, cookies, chips, crisps, crackers, candies, sweetened drinks, etc.  o Read labels and AVOID/DECREASE use of foods with the following in their ingredient list: Sugar, fructose, high fructose corn syrup, sucrose, glucose, maltose, dextrose, molasses, cane sugar, brown sugar, any type of syrup, agave nectar, etc.   . Avoid snacking in between meals . Avoid foods made with flour o If you are going to eat food made with flour, choose those made with whole-grains; and, minimize your consumption as much as is tolerable . Avoid processed foods o These foods are generally stocked in the middle of the grocery store. Focus on shopping on the perimeter of the grocery.  . Avoid Meat  o We recommend following a plant-based diet at [facility]. Thus, we recommend avoiding meat as a general rule. Consider eating beans, legumes, eggs, and/or dairy products for regular protein sources o If you plan on eating meat limit to 4 ounces of meat at a time and choose lean options such as Fish, chicken, turkey. Avoid red meat intake such as pork and/or steak What to Include . Vegetables o GREEN LEAFY VEGETABLES: Kale, spinach, mustard greens, collard greens, cabbage, broccoli, etc. o OTHER: Asparagus, cauliflower, eggplant, carrots, peas, Brussel sprouts, tomatoes, bell peppers, zucchini, beets, cucumbers, etc. . Grains, seeds, and legumes o Beans: kidney beans, black eyed peas, garbanzo beans, black beans, pinto beans, etc. o Whole, unrefined grains: brown rice, barley, bulgur, oatmeal, etc. . Healthy fats  o Avoid highly processed fats such as vegetable oil o Examples of healthy fats: avocado, olives, virgin olive oil, dark chocolate (?72% Cocoa), nuts (peanuts, almonds, walnuts, cashews, pecans, etc.) . None to Low  Intake of Animal Sources of Protein o Meat sources: chicken, turkey, salmon, tuna. Limit to 4 ounces of meat at one time. o Consider limiting dairy sources, but when choosing dairy focus on: PLAIN Greek yogurt, cottage cheese, high-protein milk . Fruit o Choose berries  When to Eat . Intermittent Fasting: o Choosing not to eat for a specific time period, but DO FOCUS ON HYDRATION when fasting o Multiple Techniques: - Time Restricted Eating: eat 3 meals in a day, each meal lasting no more than 60 minutes, no snacks between meals - 16-18 hour fast: fast for 16 to 18 hours up to 7 days a week. Often suggested to start with 2-3 nonconsecutive days per week.  . Remember the time you sleep is counted as fasting.  . Examples of eating schedule: Fast from 7:00pm-11:00am. Eat between 11:00am-7:00pm.  - 24-hour fast: fast for 24 hours up to every other day. Often suggested to start with 1 day per week . Remember the time you sleep is counted as fasting . Examples of eating schedule:  o Eating day: eat 2-3 meals on your eating day. If doing 2 meals, each meal should last no more than 90 minutes. If doing 3 meals, each meal should last no more than 60 minutes. Finish last meal by 7:00pm. o Fasting day: Fast until 7:00pm.  o IF YOU FEEL UNWELL FOR ANY REASON/IN ANY WAY WHEN FASTING, STOP FASTING BY EATING A NUTRITIOUS SNACK OR LIGHT MEAL o ALWAYS FOCUS ON HYDRATION DURING FASTS - Acceptable Hydration sources: water, broths, tea/coffee (black tea/coffee is best but using a small amount of whole-fat dairy products in coffee/tea is acceptable).  -   Poor Hydration Sources: anything with sugar or artificial sweeteners added to it  These recommendations have been developed for patients that are actively receiving medical care from either Dr. Trevonte Ashkar or Sarah Gray, DNP, NP-C at [facility]. These recommendations are developed for patients with specific medical conditions and are not meant to be  distributed or used by others that are not actively receiving care from either provider listed above at [facility]. It is not appropriate to participate in the above eating plans without proper medical supervision.   Reference: Fung, J. The obesity code. [place]: [place]; 2016.   

## 2020-09-22 NOTE — Progress Notes (Signed)
Metrics: Intervention Frequency ACO  Documented Smoking Status Yearly  Screened one or more times in 24 months  Cessation Counseling or  Active cessation medication Past 24 months  Past 24 months   Guideline developer: UpToDate (See UpToDate for funding source) Date Released: 2014       Wellness Office Visit  Subjective:  Patient ID: Miguel Reynolds, male    DOB: [birth year]  Age: 50 y.o. MRN: [id number]  CC: This man was scheduled for an annual physical today but we had several things that we need to discuss so we have postponed it for the next time. HPI  I reviewed all his blood work with him which shows significantly low testosterone levels which would be consistent with some of his symptoms such as poor energy, poor focus and concentration and decreased libido. He also has dyslipidemia quite typical of increased visceral fat and elevated liver enzymes, consistent with probable fatty liver disease.  His mother apparently appears to have similar pathology. Past Medical History:  Diagnosis Date  . Dysphagia   . GERD (gastroesophageal reflux disease)    Past Surgical History:  Procedure Laterality Date  . FINGER SURGERY     pins  . SHOULDER SURGERY Right      Family History  Problem Relation Age of Onset  . Liver disease Mother   . Heart disease Father   . Colon cancer Neg Hx   . Colon polyps Neg Hx     Social History   Social History Narrative   Patient lives at home with his wife Miguel Reynolds). Married for 24 years.   Patient works full time at [name] , and he is a Visual merchandiser.Raises goats.   Education [employer] and some college.   Right handed.   Caffeine one cup daily coffee.    Social History   Tobacco Use  . Smoking status: Never Smoker  . Smokeless tobacco: Never Used  Substance Use Topics  . Alcohol use: Yes    Comment: rarely    Current Meds  Medication Sig  . cetirizine (ZYRTEC) 10 MG tablet Take 10 mg by mouth daily.  Marland Kitchen ibuprofen  (ADVIL,MOTRIN) 200 MG tablet Take 800 mg by mouth every 6 (six) hours as needed. For pain  . Multiple Vitamins-Minerals (MULTIVITAMINS THER. W/MINERALS) TABS Take 1 tablet by mouth daily.  Marland Kitchen omeprazole (PRILOSEC) 40 MG capsule Take 1 capsule (40 mg total) by mouth daily. Take 30 minutes before breakfast  . testosterone cypionate (DEPO-TESTOSTERONE) 200 MG/ML injection Inject 0.5 mLs (100 mg total) into the muscle every 7 (seven) days.     Marland KitchenPHQ9  Objective:   Today's Vitals: BP 114/72   Pulse 83   Temp 98.2 F (36.8 C) (Temporal)   Ht 6' (1.829 m)   Wt 198 lb (89.8 kg)   SpO2 97%   BMI 26.85 kg/m  Vitals with BMI 09/22/2020 07/20/2020 05/10/2020  Height 6\' 0"  6' 0.5" 6\' 0"   Weight 198 lbs 194 lbs 13 oz 193 lbs  BMI 26.85 26.04 26.17  Systolic 114 128  Diastolic 72 84 89  Pulse 83 77 69     Physical Exam  He is overweight.  He looks systemically well.     Assessment   1. Low testosterone in male   2. Concentration deficit   3. Dyslipidemia   4. Elevated liver enzymes       Tests ordered Orders Placed This Encounter  Procedures  . Hepatitis panel, acute  . COMPLETE METABOLIC PANEL WITH  GFR     Plan: 1. I discussed all his results in detail. 2. We will check an hepatitis panel just to make sure that the elevation in liver enzymes is not due to this.  We will repeat the complete metabolic panel. 3. We spent a lot of time discussing nutrition and the concept of intermittent fasting combined with a plant-based diet, ensuring hydration.  I have given him a diet sheet. 4. We also discussed testosterone therapy.  We discussed the FDA warnings, benefits and side effects and mode of administration.  He would like to proceed and I have sent a prescription for testosterone injections 100 mg intramuscular once a week.  Once he obtains the medication, he will come to the office and receive education on administration. 5. Follow-up in 2 months when we will do the annual  physical exam. 6. Today I spent 50 minutes with this patient discussing all his results and nutrition as well as testosterone therapy above.   Meds ordered this encounter  Medications  . testosterone cypionate (DEPO-TESTOSTERONE) 200 MG/ML injection    Sig: Inject 0.5 mLs (100 mg total) into the muscle every 7 (seven) days.    Dispense:  10 mL    Refill:  0    Miguel Reynolds Normajean Glasgow, MD

## 2020-09-23 LAB — COMPLETE METABOLIC PANEL WITH GFR
AG Ratio: 1.4 (calc) (ref 1.0–2.5)
ALT: 46 U/L (ref 9–46)
AST: 27 U/L (ref 10–40)
Albumin: 4.3 g/dL (ref 3.6–5.1)
Alkaline phosphatase (APISO): 68 U/L (ref 36–130)
BUN: 15 mg/dL (ref 7–25)
CO2: 28 mmol/L (ref 20–32)
Calcium: 10.2 mg/dL (ref 8.6–10.3)
Chloride: 106 mmol/L (ref 98–110)
Creat: 1.12 mg/dL (ref 0.60–1.35)
GFR, Est African American: 90 mL/min/{1.73_m2} (ref >=60)
GFR, Est Non African American: 78 mL/min/{1.73_m2} (ref >=60)
Globulin: 3 g/dL (calc) (ref 1.9–3.7)
Glucose, Bld: 86 mg/dL (ref 65–139)
Potassium: 4.7 mmol/L (ref 3.5–5.3)
Sodium: 143 mmol/L (ref 135–146)
Total Bilirubin: 0.4 mg/dL (ref 0.2–1.2)
Total Protein: 7.3 g/dL (ref 6.1–8.1)

## 2020-09-23 LAB — HEPATITIS PANEL, ACUTE
Hep A IgM: NONREACTIVE
Hep B C IgM: NONREACTIVE
Hepatitis B Surface Ag: NONREACTIVE
Hepatitis C Ab: NONREACTIVE
SIGNAL TO CUT-OFF: 0.01 (ref <1.00)

## 2020-10-05 ENCOUNTER — Other Ambulatory Visit (INDEPENDENT_AMBULATORY_CARE_PROVIDER_SITE_OTHER): Payer: Self-pay

## 2020-10-05 DIAGNOSIS — K219 Gastro-esophageal reflux disease without esophagitis: Secondary | ICD-10-CM

## 2020-10-05 MED ORDER — OMEPRAZOLE 40 MG PO CPDR: 40.0000 mg | DELAYED_RELEASE_CAPSULE | Freq: Every day | ORAL | 3 refills | Status: DC

## 2020-10-06 ENCOUNTER — Other Ambulatory Visit: Payer: Self-pay

## 2020-10-06 ENCOUNTER — Ambulatory Visit (INDEPENDENT_AMBULATORY_CARE_PROVIDER_SITE_OTHER): Payer: 59

## 2020-10-06 VITALS — Resp 18 | Ht 72.0 in | Wt 198.0 lb

## 2020-10-06 DIAGNOSIS — R7989 Other specified abnormal findings of blood chemistry: Secondary | ICD-10-CM

## 2020-10-06 MED ORDER — TESTOSTERONE CYPIONATE 200 MG/ML IM SOLN: 100.0000 mg | Freq: Once | INTRAMUSCULAR | Status: DC

## 2020-10-06 NOTE — Progress Notes (Signed)
Pt came today for the training & safety. PT was given 0.5 ml. Pt tolerated well no complaints. Pt will given self injection weekly;leg rotation. Pt brought all medication ; need RX for needles.

## 2020-11-08 ENCOUNTER — Telehealth (INDEPENDENT_AMBULATORY_CARE_PROVIDER_SITE_OTHER): Payer: Self-pay

## 2020-11-08 ENCOUNTER — Other Ambulatory Visit (INDEPENDENT_AMBULATORY_CARE_PROVIDER_SITE_OTHER): Payer: Self-pay | Admitting: Internal Medicine

## 2020-11-08 ENCOUNTER — Other Ambulatory Visit: Payer: Self-pay

## 2020-11-08 ENCOUNTER — Ambulatory Visit
Admission: RE | Admit: 2020-11-08 | Discharge: 2020-11-08 | Disposition: A | Discharge status: Home / Self Care | Payer: 59 | Source: Ambulatory Visit | Attending: Family Medicine | Admitting: Family Medicine

## 2020-11-08 VITALS — BP 127/88 | HR 88 | Temp 98.9°F | Resp 17

## 2020-11-08 DIAGNOSIS — J0140 Acute pansinusitis, unspecified: Secondary | ICD-10-CM

## 2020-11-08 MED ORDER — TESTOSTERONE CYPIONATE 200 MG/ML IM SOLN: 100.0000 mg | INTRAMUSCULAR | 1 refills | Status: DC

## 2020-11-08 MED ORDER — AMOXICILLIN 875 MG PO TABS: 875.0000 mg | ORAL_TABLET | Freq: Two times a day (BID) | ORAL | 0 refills | Status: DC

## 2020-11-08 MED ORDER — IPRATROPIUM BROMIDE 0.03 % NA SOLN: 2.0000 | Freq: Two times a day (BID) | NASAL | 0 refills | Status: AC

## 2020-11-08 NOTE — Telephone Encounter (Signed)
Patient stated that he would like to have the big bottle. Patient stated that [facility] told him that this was only for 60 days and they needed a 90 day prescription. Not sure why the pharmacy told him that. You may want to send in again just to make sure they receive it. Thanks!

## 2020-11-08 NOTE — Telephone Encounter (Signed)
The prescription I sent at the beginning of February was a 10 mL vial.  It was his insurance that probably decided that he get small bottles.  The patient has to decide what he would like to do.  I can send another prescription again with a 10 mL vial.  He may have the same problem and he may have to pay of pocket.

## 2020-11-08 NOTE — ED Triage Notes (Signed)
Congestion, cough, drainage  X 1 1/2 weeks

## 2020-11-08 NOTE — Telephone Encounter (Signed)
Let the patient know that I have sent another prescription to [facility] on [address].

## 2020-11-08 NOTE — ED Provider Notes (Signed)
[facility]    CSN: [id number] Arrival date & time: 11/08/20  1247      History   Chief Complaint No chief complaint on file.   HPI Miguel Reynolds is a 50 y.o. male.   HPI  Patient presents with concern of congestion, cough, and nasal drainage x 1.5  Weeks. Cough and congestion worsened over the last few days. No known fever. No sick contacts.Taken multiple over the counter medications without relief of symptoms.  Past Medical History:  Diagnosis Date  . Dysphagia   . GERD (gastroesophageal reflux disease)     Patient Active Problem List   Diagnosis Date Noted  . Dysphagia 07/17/2018  . Congenital nasal septum deviation 03/06/2016  . OSA on CPAP 03/06/2016  . 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 Snorings 10/14/2014  . UARS (upper airway resistance syndrome) 10/14/2014  . 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 GERD (gastroesophageal reflux disease) 06/22/2011  . Dysphagia 06/22/2011    Past Surgical History:  Procedure Laterality Date  . FINGER SURGERY     pins  . SHOULDER SURGERY Right        Home Medications    Prior to Admission medications   Medication Sig Start Date End Date Taking? Authorizing Provider  cetirizine (ZYRTEC) 10 MG tablet Take 10 mg by mouth daily.    [provider]  ibuprofen (ADVIL,MOTRIN) 200 MG tablet Take 800 mg by mouth every 6 (six) hours as needed. For pain    [provider]  Multiple Vitamins-Minerals (MULTIVITAMINS THER. W/MINERALS) TABS Take 1 tablet by mouth daily.    [provider]  omeprazole (PRILOSEC) 40 MG capsule Take 1 capsule (40 mg total) by mouth daily. Take 30 minutes before breakfast 10/05/20   Marguerita Merles, Reuel Boom, MD  testosterone cypionate (DEPO-TESTOSTERONE) 200 MG/ML injection Inject 0.5 mLs (100 mg total) into the muscle every 7 (seven) days. 09/22/20   Wilson Singer, MD    Family History Family History  Problem Relation Age of Onset  . Liver disease Mother   . Heart  disease Father   . Colon cancer Neg Hx   . Colon polyps Neg Hx     Social History Social History   Tobacco Use  . Smoking status: Never Smoker  . Smokeless tobacco: Never Used  Vaping Use  . Vaping Use: Never used  Substance Use Topics  . Alcohol use: Yes    Comment: rarely  . Drug use: No     Allergies   Patient has no known allergies.   Review of Systems Review of Systems Pertinent negatives listed in HPI   Physical Exam Triage Vital Signs ED Triage Vitals  Enc Vitals Group     BP 11/08/20 1325 127/88     Pulse Rate 11/08/20 1325 88     Resp 11/08/20 1325 17     Temp 11/08/20 1325 98.9 F (37.2 C)     Temp Source 11/08/20 1325 Oral     SpO2 11/08/20 1325 96 %     Weight --      Height --      Head Circumference --      Peak Flow --      Pain Score 11/08/20 1333 0     Pain Loc --      Pain Edu? --      Excl. in GC? --    No data found.  Updated Vital Signs BP 127/88 (BP Location: Right Arm)   Pulse 88   Temp  98.9 F (37.2 C) (Oral)   Resp 17   SpO2 96%   Visual Acuity Right Eye Distance:   Left Eye Distance:   Bilateral Distance:    Right Eye Near:   Left Eye Near:    Bilateral Near:     Physical Exam   General Appearance:    Alert, cooperative, no distress  HENT:   Normocephalic, ears normal, nares mucosal edema with congestion, rhinorrhea, oropharynx clear   Eyes:    PERRL, conjunctiva/corneas clear, EOM's intact       Lungs:     Clear to auscultation bilaterally, respirations unlabored  Heart:    Regular rate and rhythm  Neurologic:   Awake, alert, oriented x 3. No apparent focal neurological           defect.      UC Treatments / Results  Labs (all labs ordered are listed, but only abnormal results are displayed) Labs Reviewed - No data to display  EKG   Radiology No results found.  Procedures Procedures (including critical care time)  Medications Ordered in UC Medications - No data to display  Initial Impression /  Assessment and Plan / UC Course  I have reviewed the triage vital signs and the nursing notes.  Pertinent labs & imaging results that were available during my care of the patient were reviewed by me and considered in my medical decision making (see chart for details).    Start Amoxicillin1 tablet every 12 hours x 10 days.  Continue antihistamine. Start Ipratropium (Atrovent) 2 sprays, twice daily as needed. If symptoms worsen, follow-up with PCP. Final Clinical Impressions(s) / UC Diagnoses   Final diagnoses:  Acute non-recurrent pansinusitis   Discharge Instructions   None    ED Prescriptions    Medication Sig Dispense Auth. Provider   amoxicillin (AMOXIL) 875 MG tablet Take 1 tablet (875 mg total) by mouth 2 (two) times daily. 20 tablet Bing Neighbors, FNP   ipratropium (ATROVENT) 0.03 % nasal spray Place 2 sprays into both nostrils 2 (two) times daily. 30 mL Bing Neighbors, FNP     PDMP not reviewed this encounter.   Bing Neighbors, FNP 11/10/20 [phone]

## 2020-11-08 NOTE — Telephone Encounter (Signed)
Called patient and he is aware. Patient verbalized an understanding.

## 2020-11-08 NOTE — Telephone Encounter (Signed)
Patients wife Miguel Reynolds called and left a voice message to see if you can send in a 90 day refill for the patients testosterone? [facility] does not fill 60 days and they only gave Miguel Reynolds a 30 day refill. Patient is requesting a 90 day refill as it is cheaper for insurance.   testosterone cypionate (DEPO-TESTOSTERONE) 200 MG/ML injection  Last filled 09/22/2020, [facility] gave them a really small bottle and told them it would last for 30 days. [facility] told them the Rx they received was for 60 days and not 90 days.  Can you send for 90 days?

## 2020-11-09 NOTE — Telephone Encounter (Signed)
Called [facility] and spoke to Maceo(??) and she stated that insurance will only cover 61mL per month for patient instead of the 25mL which is over 90 days supply. Patient is aware.

## 2020-12-06 ENCOUNTER — Ambulatory Visit (INDEPENDENT_AMBULATORY_CARE_PROVIDER_SITE_OTHER): Payer: 59 | Admitting: Internal Medicine

## 2020-12-06 ENCOUNTER — Encounter (INDEPENDENT_AMBULATORY_CARE_PROVIDER_SITE_OTHER): Payer: Self-pay | Admitting: Internal Medicine

## 2020-12-06 ENCOUNTER — Other Ambulatory Visit: Payer: Self-pay

## 2020-12-06 ENCOUNTER — Other Ambulatory Visit: Payer: Self-pay | Admitting: Family Medicine

## 2020-12-06 VITALS — BP 122/80 | HR 89 | Temp 98.0°F | Ht 72.0 in | Wt 205.2 lb

## 2020-12-06 DIAGNOSIS — Z0001 Encounter for general adult medical examination with abnormal findings: Secondary | ICD-10-CM

## 2020-12-06 DIAGNOSIS — R7989 Other specified abnormal findings of blood chemistry: Secondary | ICD-10-CM | POA: Diagnosis not present

## 2020-12-06 DIAGNOSIS — E785 Hyperlipidemia, unspecified: Secondary | ICD-10-CM | POA: Diagnosis not present

## 2020-12-06 MED ORDER — TESTOSTERONE CYPIONATE 200 MG/ML IM SOLN: 100.0000 mg | INTRAMUSCULAR | 1 refills | Status: AC

## 2020-12-06 NOTE — Progress Notes (Signed)
Chief Complaint: This pleasant 50 year old man comes in for an annual physical exam. HPI: He is doing reasonably well and he is started on testosterone therapy once a week.  He does not notice any difference in how he feels.  His baseline testosterone levels were extremely low below 100. He also has gastroesophageal reflux disease and takes a PPI. He also has dyslipidemia with significantly low HDL cholesterol levels.  Past Medical History:  Diagnosis Date  . Dysphagia   . GERD (gastroesophageal reflux disease)    Past Surgical History:  Procedure Laterality Date  . FINGER SURGERY     pins  . SHOULDER SURGERY Right      Social History   Social History Narrative   Patient lives at home with his wife Miguel Reynolds). Married for 24 years.   Patient works full time at [name] , and he is a Visual merchandiser.Raises goats.   Education [employer] and some college.   Right handed.   Caffeine one cup daily coffee.     Social History   Tobacco Use  . Smoking status: Never Smoker  . Smokeless tobacco: Never Used  Substance Use Topics  . Alcohol use: Yes    Comment: rarely      Allergies: No Known Allergies   Current Meds  Medication Sig  . cetirizine (ZYRTEC) 10 MG tablet Take 10 mg by mouth daily.  Marland Kitchen ibuprofen (ADVIL,MOTRIN) 200 MG tablet Take 800 mg by mouth every 6 (six) hours as needed. For pain  . ipratropium (ATROVENT) 0.03 % nasal spray Place 2 sprays into both nostrils 2 (two) times daily.  . Multiple Vitamins-Minerals (MULTIVITAMINS THER. W/MINERALS) TABS Take 1 tablet by mouth daily.  Marland Kitchen omeprazole (PRILOSEC) 40 MG capsule Take 1 capsule (40 mg total) by mouth daily. Take 30 minutes before breakfast  . [DISCONTINUED] testosterone cypionate (DEPO-TESTOSTERONE) 200 MG/ML injection Inject 0.5 mLs (100 mg total) into the muscle every 7 (seven) days.     Flowsheet Row Office Visit from 12/06/2020 in [facility]  PHQ-9 Total Score 0      EXB:MWUXL from the  symptoms mentioned above,there are no other symptoms referable to all systems reviewed.       Physical Exam: Blood pressure 122/80, pulse 89, temperature 98 F (36.7 C), temperature source Temporal, height 6' (1.829 m), weight 205 lb 3.2 oz (93.1 kg), SpO2 99 %. Vitals with BMI 12/06/2020 11/08/2020 10/06/2020  Height 6\' 0"  - 6\' 0"   Weight 205 lbs 3 oz - 198 lbs  BMI 27.82 - 26.85  Systolic 122 127 -  Diastolic 80 88 -  Pulse 89 88 -      He looks systemically well.  Muscular. General: Alert, cooperative, and appears to be the stated age.No pallor.  No jaundice.  No clubbing. Head: Normocephalic Eyes: Sclera white, pupils equal and reactive to light, red reflex x 2,  Ears: Normal bilaterally Oral cavity: Lips, mucosa, and tongue normal: Teeth and gums normal Neck: No adenopathy, supple, symmetrical, trachea midline, and thyroid does not appear enlarged Respiratory: Clear to auscultation bilaterally.No wheezing, crackles or bronchial breathing. Cardiovascular: Heart sounds are present and appear to be normal without murmurs or added sounds.  No carotid bruits.  Peripheral pulses are present and equal bilaterally.: Gastrointestinal:positive bowel sounds, no hepatosplenomegaly.  No masses felt.No tenderness. Skin: Clear, No rashes noted.No worrisome skin lesions seen. Neurological: Grossly intact without focal findings, cranial nerves II through XII intact, muscle strength equal bilaterally Musculoskeletal: No acute joint abnormalities noted.Full range  of movement noted with joints. Psychiatric: Affect appropriate, non-anxious.    Assessment  1. Encounter for general adult medical examination with abnormal findings   2. Low testosterone in male   3. Dyslipidemia     Tests Ordered:  No orders of the defined types were placed in this encounter.    Plan  1. Healthy looking 50 year old man with hypogonadism. 2. I recommended to increase the dose of testosterone now to 100 mg  intramuscular twice a week. 3. Continue with nutrition improvement with intermittent fasting and more of a plant-based diet. 4. Tdap vaccination given today to update him. 5. Follow-up in 2 months and we will repeat some blood work then.     Meds ordered this encounter  Medications  . testosterone cypionate (DEPO-TESTOSTERONE) 200 MG/ML injection    Sig: Inject 0.5 mLs (100 mg total) into the muscle 2 (two) times a week.    Dispense:  10 mL    Refill:  1    Higher dose     Miguel Reynolds   12/06/2020, 4:44 PM

## 2020-12-06 NOTE — Patient Instructions (Signed)
[facility] Dietary Recommendations for Weight Loss What to Avoid . Avoid added sugars o Often added sugar can be found in processed foods such as many condiments, dry cereals, cakes, cookies, chips, crisps, crackers, candies, sweetened drinks, etc.  o Read labels and AVOID/DECREASE use of foods with the following in their ingredient list: Sugar, fructose, high fructose corn syrup, sucrose, glucose, maltose, dextrose, molasses, cane sugar, brown sugar, any type of syrup, agave nectar, etc.   . Avoid snacking in between meals . Avoid foods made with flour o If you are going to eat food made with flour, choose those made with whole-grains; and, minimize your consumption as much as is tolerable . Avoid processed foods o These foods are generally stocked in the middle of the grocery store. Focus on shopping on the perimeter of the grocery.  . Avoid Meat  o We recommend following a plant-based diet at [facility]. Thus, we recommend avoiding meat as a general rule. Consider eating beans, legumes, eggs, and/or dairy products for regular protein sources o If you plan on eating meat limit to 4 ounces of meat at a time and choose lean options such as Fish, chicken, turkey. Avoid red meat intake such as pork and/or steak What to Include . Vegetables o GREEN LEAFY VEGETABLES: Kale, spinach, mustard greens, collard greens, cabbage, broccoli, etc. o OTHER: Asparagus, cauliflower, eggplant, carrots, peas, Brussel sprouts, tomatoes, bell peppers, zucchini, beets, cucumbers, etc. . Grains, seeds, and legumes o Beans: kidney beans, black eyed peas, garbanzo beans, black beans, pinto beans, etc. o Whole, unrefined grains: brown rice, barley, bulgur, oatmeal, etc. . Healthy fats  o Avoid highly processed fats such as vegetable oil o Examples of healthy fats: avocado, olives, virgin olive oil, dark chocolate (?72% Cocoa), nuts (peanuts, almonds, walnuts, cashews, pecans, etc.) . None to Low  Intake of Animal Sources of Protein o Meat sources: chicken, turkey, salmon, tuna. Limit to 4 ounces of meat at one time. o Consider limiting dairy sources, but when choosing dairy focus on: PLAIN Greek yogurt, cottage cheese, high-protein milk . Fruit o Choose berries  When to Eat . Intermittent Fasting: o Choosing not to eat for a specific time period, but DO FOCUS ON HYDRATION when fasting o Multiple Techniques: - Time Restricted Eating: eat 3 meals in a day, each meal lasting no more than 60 minutes, no snacks between meals - 16-18 hour fast: fast for 16 to 18 hours up to 7 days a week. Often suggested to start with 2-3 nonconsecutive days per week.  . Remember the time you sleep is counted as fasting.  . Examples of eating schedule: Fast from 7:00pm-11:00am. Eat between 11:00am-7:00pm.  - 24-hour fast: fast for 24 hours up to every other day. Often suggested to start with 1 day per week . Remember the time you sleep is counted as fasting . Examples of eating schedule:  o Eating day: eat 2-3 meals on your eating day. If doing 2 meals, each meal should last no more than 90 minutes. If doing 3 meals, each meal should last no more than 60 minutes. Finish last meal by 7:00pm. o Fasting day: Fast until 7:00pm.  o IF YOU FEEL UNWELL FOR ANY REASON/IN ANY WAY WHEN FASTING, STOP FASTING BY EATING A NUTRITIOUS SNACK OR LIGHT MEAL o ALWAYS FOCUS ON HYDRATION DURING FASTS - Acceptable Hydration sources: water, broths, tea/coffee (black tea/coffee is best but using a small amount of whole-fat dairy products in coffee/tea is acceptable).  -   Poor Hydration Sources: anything with sugar or artificial sweeteners added to it  These recommendations have been developed for patients that are actively receiving medical care from either Dr. Austyn Seier or Sarah Gray, DNP, NP-C at [facility]. These recommendations are developed for patients with specific medical conditions and are not meant to be  distributed or used by others that are not actively receiving care from either provider listed above at [facility]. It is not appropriate to participate in the above eating plans without proper medical supervision.   Reference: Fung, J. The obesity code. [place]: [place]; 2016.   

## 2021-01-24 ENCOUNTER — Ambulatory Visit (INDEPENDENT_AMBULATORY_CARE_PROVIDER_SITE_OTHER): Payer: 59 | Admitting: Internal Medicine

## 2021-01-31 ENCOUNTER — Other Ambulatory Visit (INDEPENDENT_AMBULATORY_CARE_PROVIDER_SITE_OTHER): Payer: Self-pay | Admitting: Internal Medicine

## 2021-01-31 ENCOUNTER — Ambulatory Visit (INDEPENDENT_AMBULATORY_CARE_PROVIDER_SITE_OTHER): Payer: 59 | Admitting: Internal Medicine

## 2021-02-02 ENCOUNTER — Other Ambulatory Visit: Payer: Self-pay

## 2021-02-02 ENCOUNTER — Ambulatory Visit (INDEPENDENT_AMBULATORY_CARE_PROVIDER_SITE_OTHER): Payer: 59 | Admitting: Internal Medicine

## 2021-02-02 ENCOUNTER — Encounter (INDEPENDENT_AMBULATORY_CARE_PROVIDER_SITE_OTHER): Payer: Self-pay | Admitting: Internal Medicine

## 2021-02-02 VITALS — BP 128/80 | HR 92 | Temp 97.3°F | Ht 72.0 in | Wt 208.0 lb

## 2021-02-02 DIAGNOSIS — E785 Hyperlipidemia, unspecified: Secondary | ICD-10-CM

## 2021-02-02 DIAGNOSIS — R7989 Other specified abnormal findings of blood chemistry: Secondary | ICD-10-CM

## 2021-02-02 DIAGNOSIS — E559 Vitamin D deficiency, unspecified: Secondary | ICD-10-CM | POA: Diagnosis not present

## 2021-02-02 NOTE — Progress Notes (Signed)
Metrics: Intervention Frequency ACO  Documented Smoking Status Yearly  Screened one or more times in 24 months  Cessation Counseling or  Active cessation medication Past 24 months  Past 24 months   Guideline developer: UpToDate (See UpToDate for funding source) Date Released: 2014       Wellness Office Visit  Subjective:  Patient ID: Miguel Reynolds, male    DOB: [birth year]  Age: 50 y.o. MRN: [id number]  CC: This man comes in for follow-up of hypogonadism and dyslipidemia. HPI  Since we increase the testosterone dose to twice a week, he feels much improved with more energy.  He is trying to eat better with intermittent fasting is much as he can, he has not got the 16 hours yet and is trying to eat healthier overall.  He does feel better. He has dyslipidemia with significantly low HDL levels. Past Medical History:  Diagnosis Date   Dysphagia    GERD (gastroesophageal reflux disease)    Past Surgical History:  Procedure Laterality Date   FINGER SURGERY     pins   SHOULDER SURGERY Right      Family History  Problem Relation Age of Onset   Liver disease Mother    Heart disease Father    Colon cancer Neg Hx    Colon polyps Neg Hx     Social History   Social History Narrative   Patient lives at home with his wife [name]). Married for 24 years.   Patient works full time at [name] , and he is a Visual merchandiser.Raises goats.   Education [employer] and some college.   Right handed.   Caffeine one cup daily coffee.    Social History   Tobacco Use   Smoking status: Never   Smokeless tobacco: Never  Substance Use Topics   Alcohol use: Yes    Comment: rarely    Current Meds  Medication Sig   cetirizine (ZYRTEC) 10 MG tablet Take 10 mg by mouth daily.   ipratropium (ATROVENT) 0.03 % nasal spray Place 2 sprays into both nostrils 2 (two) times daily.   MILK THISTLE PO Take 1 capsule by mouth daily.   Multiple Vitamins-Minerals (MULTIVITAMINS THER. W/MINERALS)  TABS Take 1 tablet by mouth daily.   omeprazole (PRILOSEC) 40 MG capsule Take 1 capsule (40 mg total) by mouth daily. Take 30 minutes before breakfast   testosterone cypionate (DEPO-TESTOSTERONE) 200 MG/ML injection Inject 0.5 mLs (100 mg total) into the muscle 2 (two) times a week.     Flowsheet Row Office Visit from 12/06/2020 in [facility]  PHQ-9 Total Score 0       Objective:   Today's Vitals: BP 128/80   Pulse 92   Temp (!) 97.3 F (36.3 C) (Temporal)   Ht 6' (1.829 m)   Wt 208 lb (94.3 kg)   SpO2 96%   BMI 28.21 kg/m  Vitals with BMI 02/02/2021 12/06/2020 11/08/2020  Height 6\' 0"  6\' 0"  -  Weight 208 lbs 205 lbs 3 oz -  BMI 28.2 27.82 -  Systolic 128 122  Diastolic 80 80 88  Pulse 92 89 88     Physical Exam He looks systemically well.  Weight is largely unchanged, he is muscular.      Assessment   1. Dyslipidemia   2. Low testosterone in male   3. Vitamin D deficiency disease       Tests ordered Orders Placed This Encounter  Procedures   Lipid panel  Testosterone Total,Free,Bio, Males   VITAMIN D 25 Hydroxy (Vit-D Deficiency, Fractures)      Plan: 1.  Continue with testosterone therapy and we will check levels today.  His last testosterone dose was 3 days ago. 2.  Continue to focus on nutrition for his dyslipidemia and we will check a lipid panel today. 3.  I will check vitamin D levels as this has not been checked before. 4.  Follow-up in 3 months    No orders of the defined types were placed in this encounter.   Wilson Singer, MD

## 2021-02-03 LAB — TESTOSTERONE TOTAL,FREE,BIO, MALES
Albumin: 4.1 g/dL (ref 3.6–5.1)
Sex Hormone Binding: 7 nmol/L — ABNORMAL LOW (ref 10–50)
Testosterone, Bioavailable: 320.2 ng/dL (ref 110.0–575.0)
Testosterone, Free: 170.1 pg/mL (ref 46.0–224.0)
Testosterone: 470 ng/dL (ref 250–827)

## 2021-02-03 LAB — LIPID PANEL
Cholesterol: 175 mg/dL (ref <200)
HDL: 29 mg/dL — ABNORMAL LOW (ref >=40)
LDL Cholesterol (Calc): 118 mg/dL (calc) — ABNORMAL HIGH
Non-HDL Cholesterol (Calc): 146 mg/dL (calc) — ABNORMAL HIGH (ref <130)
Total CHOL/HDL Ratio: 6 (calc) — ABNORMAL HIGH (ref <5.0)
Triglycerides: 165 mg/dL — ABNORMAL HIGH (ref <150)

## 2021-02-03 LAB — VITAMIN D 25 HYDROXY (VIT D DEFICIENCY, FRACTURES): Vit D, 25-Hydroxy: 45 ng/mL (ref 30–100)

## 2021-05-11 ENCOUNTER — Ambulatory Visit (INDEPENDENT_AMBULATORY_CARE_PROVIDER_SITE_OTHER): Payer: 59 | Admitting: Internal Medicine

## 2021-05-12 ENCOUNTER — Ambulatory Visit (INDEPENDENT_AMBULATORY_CARE_PROVIDER_SITE_OTHER): Payer: 59 | Admitting: Gastroenterology

## 2021-05-12 ENCOUNTER — Ambulatory Visit (INDEPENDENT_AMBULATORY_CARE_PROVIDER_SITE_OTHER): Payer: Self-pay | Admitting: Gastroenterology

## 2021-05-12 ENCOUNTER — Encounter (INDEPENDENT_AMBULATORY_CARE_PROVIDER_SITE_OTHER): Payer: Self-pay | Admitting: Gastroenterology

## 2021-05-12 ENCOUNTER — Other Ambulatory Visit: Payer: Self-pay

## 2021-05-12 VITALS — BP 138/86 | HR 75 | Temp 98.0°F | Ht 72.0 in | Wt 204.8 lb

## 2021-05-12 DIAGNOSIS — K219 Gastro-esophageal reflux disease without esophagitis: Secondary | ICD-10-CM | POA: Diagnosis not present

## 2021-05-12 MED ORDER — OMEPRAZOLE 40 MG PO CPDR: 40.0000 mg | DELAYED_RELEASE_CAPSULE | Freq: Every day | ORAL | 3 refills | Status: DC

## 2021-05-12 NOTE — Patient Instructions (Addendum)
Continue omeprazole 40 mg  Qday Can take Pepcid 20 mg over the counter when presenting breakthrough episodes of heartburn Please call in December 2022 to set up screening colonoscopy and EGD to screen for Barrett's esophagus

## 2021-05-12 NOTE — Progress Notes (Signed)
Miguel Reynolds, M.D. Gastroenterology & Hepatology [facility] [address] [place], [place] [address]  Primary Care Physician: Wilson Singer, MD (Inactive) No address on file  I will communicate my assessment and recommendations to the referring MD via EMR.  Problems: GERD  History of Present Illness: Miguel Reynolds is a 50 y.o. male with past medical history of GERD, who presents for follow up of GERD.  The patient was last seen on 05/10/2020. At that time, the patient was advised to continue taking 40 mg of omeprazole every day and as needed antacid.  Patient reports that he takes omeprazole 40 mg every day. States that he takes it with his vitamins and with a cup of coffee. Usually waits around 30 minutes to have something to eat. He has an episode of heartburn and regurgitation possibly once a week, which is milder compared than when he skips a dose.  He emphasizes that when he skips a dose of the PPI, he has very severe symptoms.  He takes Tums when he has a breakthrough episode. No dysphagia or odynophagia.  The patient denies having any nausea, vomiting, fever, chills, hematochezia, melena, hematemesis, abdominal distention, abdominal pain, diarrhea, jaundice, pruritus or weight loss.  Last EGD: 2012-patulous EG junction. Satus post passage of a maloney dilator. Small hiatal hernia, otherwise normal D1 and D2.  Last Colonoscopy: never  Past Medical History: Past Medical History:  Diagnosis Date   Dysphagia    GERD (gastroesophageal reflux disease)     Past Surgical History: Past Surgical History:  Procedure Laterality Date   FINGER SURGERY     pins   SHOULDER SURGERY Right     Family History: Family History  Problem Relation Age of Onset   Liver disease Mother    Heart disease Father    Colon cancer Neg Hx    Colon polyps Neg Hx     Social History: Social History   Tobacco Use  Smoking Status  Never  Smokeless Tobacco Never   Social History   Substance and Sexual Activity  Alcohol Use Yes   Comment: rarely   Social History   Substance and Sexual Activity  Drug Use No    Allergies: No Known Allergies  Medications: Current Outpatient Medications  Medication Sig Dispense Refill   cetirizine (ZYRTEC) 10 MG tablet Take 10 mg by mouth daily.     ipratropium (ATROVENT) 0.03 % nasal spray Place 2 sprays into both nostrils 2 (two) times daily. 30 mL 0   MILK THISTLE PO Take 1 capsule by mouth daily.     Multiple Vitamins-Minerals (MULTIVITAMINS THER. W/MINERALS) TABS Take 1 tablet by mouth daily.     omeprazole (PRILOSEC) 40 MG capsule Take 1 capsule (40 mg total) by mouth daily. Take 30 minutes before breakfast 90 capsule 3   OVER THE COUNTER MEDICATION Milk Thistle one per day Tumeric one per day. Vit d once per day Glucose tablet daily.     testosterone cypionate (DEPO-TESTOSTERONE) 200 MG/ML injection Inject 0.5 mLs (100 mg total) into the muscle 2 (two) times a week. 10 mL 1   No current facility-administered medications for this visit.    Review of Systems: GENERAL: negative for malaise, night sweats HEENT: No changes in hearing or vision, no nose bleeds or other nasal problems. NECK: Negative for lumps, goiter, pain and significant neck swelling RESPIRATORY: Negative for cough, wheezing CARDIOVASCULAR: Negative for chest pain, leg swelling, palpitations, orthopnea GI: SEE HPI MUSCULOSKELETAL: Negative  for joint pain or swelling, back pain, and muscle pain. SKIN: Negative for lesions, rash PSYCH: Negative for sleep disturbance, mood disorder and recent psychosocial stressors. HEMATOLOGY Negative for prolonged bleeding, bruising easily, and swollen nodes. ENDOCRINE: Negative for cold or heat intolerance, polyuria, polydipsia and goiter. NEURO: negative for tremor, gait imbalance, syncope and seizures. The remainder of the review of systems is  noncontributory.   Physical Exam: BP 138/86 (BP Location: Left Arm, Patient Position: Sitting, Cuff Size: Large)   Pulse 75   Temp 98 F (36.7 C) (Oral)   Ht 6' (1.829 m)   Wt 204 lb 12.8 oz (92.9 kg)   BMI 27.78 kg/m  GENERAL: The patient is AO x3, in no acute distress. HEENT: Head is normocephalic and atraumatic. EOMI are intact. Mouth is well hydrated and without lesions. NECK: Supple. No masses LUNGS: Clear to auscultation. No presence of rhonchi/wheezing/rales. Adequate chest expansion HEART: RRR, normal s1 and s2. ABDOMEN: Soft, nontender, no guarding, no peritoneal signs, and nondistended. BS +. No masses. EXTREMITIES: Without any cyanosis, clubbing, rash, lesions or edema. NEUROLOGIC: AOx3, no focal motor deficit. SKIN: no jaundice, no rashes  Imaging/Labs: as above  I personally reviewed and interpreted the available labs, imaging and endoscopic files.  Impression and Plan: Miguel Reynolds is a 50 y.o. male with past medical history of GERD, who presents for follow up of GERD.  The patient has presented relatively adequate control of his GERD and denies having any major complaints at the moment.  Has not presented any red flag signs.  I do consider that he should continue taking the current dose of omeprazole 40 mg every day, but I advised him to take Pepcid instead of Tums when he presents of breakthrough episodes of reflux, which he understood and agreed.  We also thoroughly discussed the need to proceed with colorectal cancer screening with colonoscopy, however he would like to postpone it for January 2023 due to time constraints.  At that time, we will also proceed with a repeat EGD to screen for Barrett's esophagus given his longstanding history of GERD.  - Continue omeprazole 40 mg qday - Can take Pepcid 20 mg over the counter when presenting breakthrough episodes of heartburn - Patient to call in December 2022 to set up screening colonoscopy and EGD to screen for  Barrett's esophagus  Total visit time: 32 minutes  All questions were answered.      Dolores Frame, MD Gastroenterology and Hepatology [facility]

## 2021-10-08 ENCOUNTER — Other Ambulatory Visit (INDEPENDENT_AMBULATORY_CARE_PROVIDER_SITE_OTHER): Payer: Self-pay | Admitting: Gastroenterology

## 2021-10-08 DIAGNOSIS — K219 Gastro-esophageal reflux disease without esophagitis: Secondary | ICD-10-CM

## 2021-10-10 NOTE — Telephone Encounter (Signed)
Pt seen 05/12/21 note states to continue omeprazole and to call back in December to set up tcs and egd for barrett's. No appt has been set up.

## 2022-02-09 ENCOUNTER — Encounter (INDEPENDENT_AMBULATORY_CARE_PROVIDER_SITE_OTHER): Payer: Self-pay | Admitting: *Deleted

## 2022-07-17 ENCOUNTER — Encounter (INDEPENDENT_AMBULATORY_CARE_PROVIDER_SITE_OTHER): Payer: Self-pay | Admitting: *Deleted

## 2022-08-10 ENCOUNTER — Encounter (INDEPENDENT_AMBULATORY_CARE_PROVIDER_SITE_OTHER): Payer: Self-pay | Admitting: *Deleted

## 2022-10-27 ENCOUNTER — Other Ambulatory Visit (INDEPENDENT_AMBULATORY_CARE_PROVIDER_SITE_OTHER): Payer: Self-pay | Admitting: Gastroenterology

## 2022-10-27 DIAGNOSIS — K219 Gastro-esophageal reflux disease without esophagitis: Secondary | ICD-10-CM

## 2023-01-15 ENCOUNTER — Encounter (INDEPENDENT_AMBULATORY_CARE_PROVIDER_SITE_OTHER): Payer: Self-pay | Admitting: *Deleted

## 2023-08-31 ENCOUNTER — Other Ambulatory Visit (HOSPITAL_COMMUNITY): Payer: Self-pay | Admitting: Registered Nurse

## 2023-08-31 DIAGNOSIS — E7800 Pure hypercholesterolemia, unspecified: Secondary | ICD-10-CM

## 2023-09-07 ENCOUNTER — Encounter (INDEPENDENT_AMBULATORY_CARE_PROVIDER_SITE_OTHER): Payer: Self-pay | Admitting: *Deleted

## 2023-10-10 NOTE — Progress Notes (Signed)
 [facility] Telephone:([phone]) [phone]   [phone]  CONSULT NOTE  REFERRING PHYSICIAN: Lauretta Chester NP  REASON FOR CONSULTATION:  Splenomegaly   HPI Miguel Reynolds is a 53 y.o. male with a past medical history of OSA (compliant with CPAP), GERD, fatty liver disease, low testosterone (administers injections once a week), shoulder surgery-neck surgery due to bone spur, and esophageal dilation is referred to the clinic for splenomegaly.   The patient had an annual visit with his PCP on 08/31/23. He had routine labs performed which showed mild elevations in his ALT (68 and 54) (remaining CMP unremarkable). Therefore, an ultrasound of the liver was performed to further evaluate this on 09/06/23.  His liver ultrasound showed fatty liver disease. It also showed enlarged spleen, measuring 13 cm in the maximum sagittal diameter.   Due to concern for this splenomegaly the patient was referred to hematology for further evaluation and management.  Of note, he was also referred to GI for consideration of routine colonoscopy. However, it looks like he was seen by GI in [place] in September 2022.  The patient states he is aware he is supposed to follow up with them and will call them to make an appointment.  Of note, he also had a CBC which showed normal WBC at 7.8, normal Hbg of 14.2, microcytosis with MCV of 76 (he donates blood every 3-4 months due to taking testosterone and has mild IDA) and low MCH of 24.1, and slightly elevated RBC of 5.9. His platelet count was normal at 329k.   Overall, the patient is feeling "ok" except for fatigue, which is normal for him. He works 3 jobs. He also is recovering from a URI. Several of his coworkers have the flu or a URI. He has had this about 1 week and his symptoms include nasal congestion and cough.   To his knowledge, he has never been told he has splenomegaly in the past. We do not have any prior abdominal imaging per chart  review.  He denies any abdominal pain or early satiety. He only experiences bloating with protein shake intake. He denies appetite change or unexplained weight loss. He denies fevers, chills, night sweats, or lymphadenopathy. Besides the current URI, he denies infections such as mono, hepatitis, HIV. He denies any unusual shortnes of breath. He denies nausea, vomiting, diarrhea or constipation. He denies any known personal autoimmune disorders or inflammatory disorders such as sarcoid, lupus, or RA. Denies foreign travel.   The patient's mother passed away secondary to liver cirrhosis which was not related to alcohol intake. His father is unhealthy and has several medical problems including congestive heart failure. However, the patient's father does not have any family history of blood disorders, malignancies, or bone marrow disorders.  All 6 of his siblings are healthy.  The patient works 3 jobs.  He works on a farm, a Orthoptist, and works at a cigarette company.  The patient is a never smoker.  He is married.  He does not have any children.  Denies any history of heavy alcohol use.  He estimates that he drinks approximately 4 alcoholic beverages per month and his beverage of choice is bourbon. However, he states in his 20's-30's he did not drink at all.  He denies any history of drug use.    HPI  Past Medical History:  Diagnosis Date   Dysphagia    GERD (gastroesophageal reflux disease)     Past Surgical History:  Procedure Laterality Date  FINGER SURGERY     pins   SHOULDER SURGERY Right     Family History  Problem Relation Age of Onset   Liver disease Mother    Heart disease Father    Colon cancer Neg Hx    Colon polyps Neg Hx     Social History Social History   Tobacco Use   Smoking status: Never   Smokeless tobacco: Never  Vaping Use   Vaping status: Never Used  Substance Use Topics   Alcohol use: Yes    Comment: rarely   Drug use: No    No Known  Allergies  Current Outpatient Medications  Medication Sig Dispense Refill   cetirizine (ZYRTEC) 10 MG tablet Take 10 mg by mouth daily.     ipratropium (ATROVENT) 0.03 % nasal spray Place 2 sprays into both nostrils 2 (two) times daily. 30 mL 0   MILK THISTLE PO Take 1 capsule by mouth daily.     Multiple Vitamins-Minerals (MULTIVITAMINS THER. W/MINERALS) TABS Take 1 tablet by mouth daily.     omeprazole (PRILOSEC) 40 MG capsule TAKE 1 CAPSULE(40 MG) BY MOUTH DAILY 30 MINUTES BEFORE BREAKFAST 90 capsule 3   OVER THE COUNTER MEDICATION Milk Thistle one per day Tumeric one per day. Vit d once per day Glucose tablet daily.     testosterone cypionate (DEPO-TESTOSTERONE) 200 MG/ML injection Inject 0.5 mLs (100 mg total) into the muscle 2 (two) times a week. 10 mL 1   No current facility-administered medications for this visit.    REVIEW OF SYSTEMS:   Review of Systems  Constitutional: Positive for baseline fatigue. Negative for appetite change, chills, fever and unexpected weight change.  HENT: Positive for nasal congestion. Negative for mouth sores, nosebleeds, sore throat and trouble swallowing.   Eyes: Negative for eye problems and icterus.  Respiratory: Positive for cough due to URI. Negative for hemoptysis, shortness of breath and wheezing.   Cardiovascular: Negative for chest pain and leg swelling.  Gastrointestinal: Negative for abdominal pain, constipation, diarrhea, nausea and vomiting.  Genitourinary: Negative for bladder incontinence, difficulty urinating, dysuria, frequency and hematuria.   Musculoskeletal: Negative for back pain, gait problem, neck pain and neck stiffness.  Skin: Negative for itching and rash.  Neurological: Negative for dizziness, extremity weakness, gait problem, headaches, light-headedness and seizures.  Hematological: Negative for adenopathy. Does not bruise/bleed easily.  Psychiatric/Behavioral: Negative for confusion, depression and sleep disturbance. The  patient is not nervous/anxious.     PHYSICAL EXAMINATION:  There were no vitals taken for this visit.  ECOG PERFORMANCE STATUS: 1  Physical Exam  Constitutional: Oriented to person, place, and time and well-developed, well-nourished, and in no distress.  HENT:  Head: Normocephalic and atraumatic.  Mouth/Throat: Oropharynx is clear and moist. No oropharyngeal exudate.  Eyes: Conjunctivae are normal. Right eye exhibits no discharge. Left eye exhibits no discharge. No scleral icterus.  Neck: Normal range of motion. Neck supple.  Cardiovascular: Normal rate, regular rhythm, normal heart sounds and intact distal pulses.   Pulmonary/Chest: Effort normal and breath sounds normal. No respiratory distress. No wheezes. No rales.  Abdominal: Soft. Bowel sounds are normal. Exhibits no distension and no mass. There is no tenderness.  Musculoskeletal: Normal range of motion. Exhibits no edema.  Lymphadenopathy:    No cervical adenopathy.  Neurological: Alert and oriented to person, place, and time. Exhibits normal muscle tone. Gait normal. Coordination normal.  Skin: Skin is warm and dry. No rash noted. Not diaphoretic. No erythema. No pallor.  Psychiatric: Mood,  memory and judgment normal.  Vitals reviewed.  LABORATORY DATA: Lab Results  Component Value Date   WBC 7.7 07/20/2020   HGB 17.4 (H) 07/20/2020   HCT 52.3 (H) 07/20/2020   MCV 86.6 07/20/2020   PLT 394 07/20/2020      Chemistry      Component Value Date/Time   NA 143 09/22/2020 1002   K 4.7 09/22/2020 1002   CL 106 09/22/2020 1002   CO2 28 09/22/2020 1002   BUN 15 09/22/2020 1002   CREATININE 1.12 09/22/2020 1002      Component Value Date/Time   CALCIUM 10.2 09/22/2020 1002   AST 27 09/22/2020 1002   ALT 46 09/22/2020 1002   BILITOT 0.4 09/22/2020 1002       RADIOGRAPHIC STUDIES: No results found.  ASSESSMENT: This is a very pleasant 53 year old male with splenomegaly  After review of the labs, review of the  records, and discussion with the patient the patients findings are most consistent with splenomegaly possibly secondary to liver disease.    # Splenomegaly  --The patient was seen with Dr. Leonides Schanz.  -- At this time findings are most consistent with splenomegaly likely related to liver disease  -- Will rule out other etiology by ordering HIV, hepatitis B, and hepatitis C -- Will order EBV PCR -- Will also collect CBC, CMP, and flow cytometry as well as CRP --No other evidence of malignancy or lymphadenopathy on exam -- Return to clinic pending the results of the above studies. If normal, no follow up needed but would be happy to see patient on as needed basis if worsening splenomegaly --He will contact his gastroenterologist for close monitoring and management of his fatty liver disease.  --Discussed lifestyle precautions with splenomegaly including avoiding contact sports       The patient voices understanding of current disease status and treatment options and is in agreement with the current care plan.  All questions were answered. The patient knows to call the clinic with any problems, questions or concerns. We can certainly see the patient much sooner if necessary.  Thank you so much for allowing me to participate in the care of Miguel Reynolds. I will continue to follow up the patient with you and assist in his care.  Disclaimer: This note was dictated with voice recognition software. Similar sounding words can inadvertently be transcribed and may not be corrected upon review.   Miryah Ralls L Darryl Willner October 10, 2023, 6:04 PM  I have read the above note and personally examined the patient. I agree with the assessment and plan as noted above.  Briefly Miguel Reynolds is a 53 year old male who presents for evaluation of mild splenomegaly.  The patient was noted to have a spleen that measured up to 13 cm.  Of note the patient does have a fatty liver disease which may be contributing  to his increased spleen size.  Additionally he is not having any signs or symptoms concerning for infectious process which may explain of the enlarged spleen.  Would recommend ordering viral studies with EBV, hepatitis C, hepatitis B, and HIV.  Additionally will order inflammatory markers with ESR and CRP.  Will plan to order flow cytometry to rule out lymphomatous process.  In the event our workup is negative would recommend continued monitoring with the liver team as I do believe the liver disease is the most likely etiology of this finding.  The patient voiced understanding of our workup and plan moving forward.  Miguel Barns, MD Department of Hematology/Oncology [facility] Phone: [phone] Pager: [phone] Email: [email or web address]

## 2023-10-12 ENCOUNTER — Inpatient Hospital Stay: Payer: 59 | Admitting: Physician Assistant

## 2023-10-12 ENCOUNTER — Other Ambulatory Visit: Payer: Self-pay | Admitting: Physician Assistant

## 2023-10-12 ENCOUNTER — Inpatient Hospital Stay: Payer: 59

## 2023-10-12 ENCOUNTER — Inpatient Hospital Stay: Payer: 59 | Attending: Physician Assistant

## 2023-10-12 VITALS — BP 147/81 | HR 93 | Temp 99.1°F | Resp 16 | Wt 211.6 lb

## 2023-10-12 DIAGNOSIS — R161 Splenomegaly, not elsewhere classified: Secondary | ICD-10-CM

## 2023-10-12 DIAGNOSIS — D509 Iron deficiency anemia, unspecified: Secondary | ICD-10-CM | POA: Insufficient documentation

## 2023-10-12 DIAGNOSIS — K760 Fatty (change of) liver, not elsewhere classified: Secondary | ICD-10-CM

## 2023-10-12 LAB — CBC WITH DIFFERENTIAL (CANCER CENTER ONLY)
Abs Immature Granulocytes: 0.04 10*3/uL (ref 0.00–0.07)
Basophils Absolute: 0 10*3/uL (ref 0.0–0.1)
Basophils Relative: 1 %
Eosinophils Absolute: 0 10*3/uL (ref 0.0–0.5)
Eosinophils Relative: 1 %
HCT: 47.2 % (ref 39.0–52.0)
Hemoglobin: 14.8 g/dL (ref 13.0–17.0)
Immature Granulocytes: 1 %
Lymphocytes Relative: 19 %
Lymphs Abs: 0.8 10*3/uL (ref 0.7–4.0)
MCH: 23.3 pg — ABNORMAL LOW (ref 26.0–34.0)
MCHC: 31.4 g/dL (ref 30.0–36.0)
MCV: 74.2 fL — ABNORMAL LOW (ref 80.0–100.0)
Monocytes Absolute: 0.6 10*3/uL (ref 0.1–1.0)
Monocytes Relative: 14 %
Neutro Abs: 2.7 10*3/uL (ref 1.7–7.7)
Neutrophils Relative %: 64 %
Platelet Count: 261 10*3/uL (ref 150–400)
RBC: 6.36 MIL/uL — ABNORMAL HIGH (ref 4.22–5.81)
RDW: 17.7 % — ABNORMAL HIGH (ref 11.5–15.5)
WBC Count: 4.1 10*3/uL (ref 4.0–10.5)
nRBC: 0 % (ref 0.0–0.2)

## 2023-10-12 LAB — CMP (CANCER CENTER ONLY)
ALT: 70 U/L — ABNORMAL HIGH (ref 0–44)
AST: 42 U/L — ABNORMAL HIGH (ref 15–41)
Albumin: 4.1 g/dL (ref 3.5–5.0)
Alkaline Phosphatase: 76 U/L (ref 38–126)
Anion gap: 4 — ABNORMAL LOW (ref 5–15)
BUN: 12 mg/dL (ref 6–20)
CO2: 29 mmol/L (ref 22–32)
Calcium: 8.8 mg/dL — ABNORMAL LOW (ref 8.9–10.3)
Chloride: 104 mmol/L (ref 98–111)
Creatinine: 1.12 mg/dL (ref 0.61–1.24)
GFR, Estimated: >60 mL/min (ref >60)
Glucose, Bld: 93 mg/dL (ref 70–99)
Potassium: 3.8 mmol/L (ref 3.5–5.1)
Sodium: 137 mmol/L (ref 135–145)
Total Bilirubin: 0.4 mg/dL (ref 0.0–1.2)
Total Protein: 7.8 g/dL (ref 6.5–8.1)

## 2023-10-12 LAB — LACTATE DEHYDROGENASE: LDH: 156 U/L (ref 98–192)

## 2023-10-12 LAB — HEPATITIS B SURFACE ANTIGEN
Hepatitis B Surface Ag: NONREACTIVE
Hepatitis B Surface Ag: NONREACTIVE

## 2023-10-12 LAB — HIV ANTIBODY (ROUTINE TESTING W REFLEX): HIV Screen 4th Generation wRfx: NONREACTIVE

## 2023-10-12 LAB — C-REACTIVE PROTEIN: CRP: 0.9 mg/dL (ref <1.0)

## 2023-10-12 LAB — HEPATITIS C ANTIBODY: HCV Ab: NONREACTIVE

## 2023-10-13 LAB — HEPATITIS B CORE ANTIBODY, TOTAL: HEP B CORE AB: NEGATIVE

## 2023-10-15 ENCOUNTER — Telehealth: Payer: Self-pay | Admitting: Physician Assistant

## 2023-10-15 LAB — EPSTEIN BARR VRS(EBV DNA BY PCR): EBV DNA QN by PCR: NEGATIVE [IU]/mL

## 2023-10-15 LAB — SURGICAL PATHOLOGY

## 2023-10-15 NOTE — Telephone Encounter (Signed)
 I called the patient to review his labs from his appointment last week. His viral testing, inflammatory markers, flow cytometry were normal. His liver enzymes were slightly elevated as expected due to his liver disease. His splenomegaly likely related to liver disease. No need to follow up with our office on a regular basis. We can see him as needed if worsening splenomegaly or any hematologic concerns. He expressed understanding and is going to call GI this week to schedule an appointment.

## 2023-10-16 LAB — FLOW CYTOMETRY

## 2024-03-06 ENCOUNTER — Encounter (INDEPENDENT_AMBULATORY_CARE_PROVIDER_SITE_OTHER): Payer: Self-pay | Admitting: *Deleted

## 2024-05-28 ENCOUNTER — Encounter (INDEPENDENT_AMBULATORY_CARE_PROVIDER_SITE_OTHER): Payer: Self-pay | Admitting: Gastroenterology

## 2024-09-04 ENCOUNTER — Other Ambulatory Visit (HOSPITAL_BASED_OUTPATIENT_CLINIC_OR_DEPARTMENT_OTHER): Payer: Self-pay | Admitting: Registered Nurse

## 2024-09-04 DIAGNOSIS — E7800 Pure hypercholesterolemia, unspecified: Secondary | ICD-10-CM

## 2024-09-22 ENCOUNTER — Other Ambulatory Visit (HOSPITAL_BASED_OUTPATIENT_CLINIC_OR_DEPARTMENT_OTHER)
